# Patient Record
Sex: Male | Born: 1985
Health system: Southern US, Community
[De-identification: ages and names within clinical notes are randomized; demographics above are authoritative.]

## PROBLEM LIST (undated history)

## (undated) DIAGNOSIS — R569 Unspecified convulsions: Secondary | ICD-10-CM

## (undated) DIAGNOSIS — F909 Attention-deficit hyperactivity disorder, unspecified type: Secondary | ICD-10-CM

## (undated) DIAGNOSIS — G40909 Epilepsy, unspecified, not intractable, without status epilepticus: Secondary | ICD-10-CM

## (undated) DIAGNOSIS — M109 Gout, unspecified: Secondary | ICD-10-CM

## (undated) DIAGNOSIS — M1990 Unspecified osteoarthritis, unspecified site: Secondary | ICD-10-CM

## (undated) DIAGNOSIS — E669 Obesity, unspecified: Secondary | ICD-10-CM

## (undated) DIAGNOSIS — K219 Gastro-esophageal reflux disease without esophagitis: Secondary | ICD-10-CM

## (undated) DIAGNOSIS — R51 Headache: Secondary | ICD-10-CM

## (undated) HISTORY — PX: COLONOSCOPY: SHX174

## (undated) HISTORY — DX: Gout, unspecified: M10.9

## (undated) HISTORY — PX: BACK SURGERY: SHX140

---

## 2013-03-24 ENCOUNTER — Encounter (HOSPITAL_COMMUNITY): Payer: Self-pay | Admitting: Emergency Medicine

## 2013-03-24 ENCOUNTER — Emergency Department (HOSPITAL_COMMUNITY)
Admission: EM | Admit: 2013-03-24 | Discharge: 2013-03-25 | Disposition: A | Discharge status: Home / Self Care | Payer: PRIVATE HEALTH INSURANCE | Attending: Emergency Medicine | Admitting: Emergency Medicine

## 2013-03-24 DIAGNOSIS — Z8711 Personal history of peptic ulcer disease: Secondary | ICD-10-CM | POA: Insufficient documentation

## 2013-03-24 DIAGNOSIS — Z79899 Other long term (current) drug therapy: Secondary | ICD-10-CM | POA: Insufficient documentation

## 2013-03-24 DIAGNOSIS — F17200 Nicotine dependence, unspecified, uncomplicated: Secondary | ICD-10-CM | POA: Insufficient documentation

## 2013-03-24 DIAGNOSIS — M545 Low back pain, unspecified: Secondary | ICD-10-CM | POA: Insufficient documentation

## 2013-03-24 DIAGNOSIS — R1012 Left upper quadrant pain: Secondary | ICD-10-CM | POA: Insufficient documentation

## 2013-03-24 DIAGNOSIS — G40909 Epilepsy, unspecified, not intractable, without status epilepticus: Secondary | ICD-10-CM | POA: Insufficient documentation

## 2013-03-24 DIAGNOSIS — R109 Unspecified abdominal pain: Secondary | ICD-10-CM

## 2013-03-24 DIAGNOSIS — R1013 Epigastric pain: Secondary | ICD-10-CM | POA: Insufficient documentation

## 2013-03-24 DIAGNOSIS — R110 Nausea: Secondary | ICD-10-CM | POA: Insufficient documentation

## 2013-03-24 DIAGNOSIS — Z87442 Personal history of urinary calculi: Secondary | ICD-10-CM | POA: Insufficient documentation

## 2013-03-24 HISTORY — DX: Unspecified convulsions: R56.9

## 2013-03-24 NOTE — ED Notes (Signed)
Since 1300 today he had a sudden sharp pain in his lt upper abd with pain radiating up  Posteriorly and into his lt shoulder.  He has had ulcers gallstones

## 2013-03-25 ENCOUNTER — Emergency Department (HOSPITAL_COMMUNITY): Payer: PRIVATE HEALTH INSURANCE

## 2013-03-25 ENCOUNTER — Encounter (HOSPITAL_COMMUNITY): Payer: Self-pay | Admitting: Emergency Medicine

## 2013-03-25 ENCOUNTER — Emergency Department (HOSPITAL_COMMUNITY)
Admission: EM | Admit: 2013-03-25 | Discharge: 2013-03-26 | Disposition: A | Discharge status: Home / Self Care | Payer: PRIVATE HEALTH INSURANCE | Attending: Emergency Medicine | Admitting: Emergency Medicine

## 2013-03-25 DIAGNOSIS — Z79899 Other long term (current) drug therapy: Secondary | ICD-10-CM | POA: Insufficient documentation

## 2013-03-25 DIAGNOSIS — R110 Nausea: Secondary | ICD-10-CM | POA: Insufficient documentation

## 2013-03-25 DIAGNOSIS — R630 Anorexia: Secondary | ICD-10-CM | POA: Insufficient documentation

## 2013-03-25 DIAGNOSIS — R209 Unspecified disturbances of skin sensation: Secondary | ICD-10-CM | POA: Insufficient documentation

## 2013-03-25 DIAGNOSIS — R200 Anesthesia of skin: Secondary | ICD-10-CM

## 2013-03-25 DIAGNOSIS — M545 Low back pain, unspecified: Secondary | ICD-10-CM | POA: Insufficient documentation

## 2013-03-25 DIAGNOSIS — R1013 Epigastric pain: Secondary | ICD-10-CM | POA: Insufficient documentation

## 2013-03-25 DIAGNOSIS — R32 Unspecified urinary incontinence: Secondary | ICD-10-CM | POA: Insufficient documentation

## 2013-03-25 DIAGNOSIS — F909 Attention-deficit hyperactivity disorder, unspecified type: Secondary | ICD-10-CM | POA: Insufficient documentation

## 2013-03-25 DIAGNOSIS — R0789 Other chest pain: Secondary | ICD-10-CM | POA: Insufficient documentation

## 2013-03-25 DIAGNOSIS — G40909 Epilepsy, unspecified, not intractable, without status epilepticus: Secondary | ICD-10-CM | POA: Insufficient documentation

## 2013-03-25 DIAGNOSIS — R9431 Abnormal electrocardiogram [ECG] [EKG]: Secondary | ICD-10-CM | POA: Insufficient documentation

## 2013-03-25 DIAGNOSIS — F17200 Nicotine dependence, unspecified, uncomplicated: Secondary | ICD-10-CM | POA: Insufficient documentation

## 2013-03-25 DIAGNOSIS — K219 Gastro-esophageal reflux disease without esophagitis: Secondary | ICD-10-CM | POA: Insufficient documentation

## 2013-03-25 HISTORY — DX: Epilepsy, unspecified, not intractable, without status epilepticus: G40.909

## 2013-03-25 HISTORY — DX: Gastro-esophageal reflux disease without esophagitis: K21.9

## 2013-03-25 HISTORY — DX: Attention-deficit hyperactivity disorder, unspecified type: F90.9

## 2013-03-25 LAB — COMPREHENSIVE METABOLIC PANEL
ALT: 25 U/L (ref 0–53)
AST: 16 U/L (ref 0–37)
AST: 18 U/L (ref 0–37)
Albumin: 3.6 g/dL (ref 3.5–5.2)
Albumin: 3.9 g/dL (ref 3.5–5.2)
Alkaline Phosphatase: 48 U/L (ref 39–117)
Alkaline Phosphatase: 55 U/L (ref 39–117)
BUN: 18 mg/dL (ref 6–23)
BUN: 19 mg/dL (ref 6–23)
CO2: 27 mEq/L (ref 19–32)
Calcium: 8.9 mg/dL (ref 8.4–10.5)
Chloride: 104 mEq/L (ref 96–112)
Potassium: 3.4 mEq/L — ABNORMAL LOW (ref 3.5–5.1)
Potassium: 4 mEq/L (ref 3.5–5.1)
Sodium: 138 mEq/L (ref 135–145)
Sodium: 141 mEq/L (ref 135–145)
Total Bilirubin: 0.2 mg/dL — ABNORMAL LOW (ref 0.3–1.2)
Total Bilirubin: 0.3 mg/dL (ref 0.3–1.2)
Total Protein: 7 g/dL (ref 6.0–8.3)
Total Protein: 7.4 g/dL (ref 6.0–8.3)

## 2013-03-25 LAB — D-DIMER, QUANTITATIVE: D-Dimer, Quant: <0.27 ug/mL-FEU (ref 0.00–0.48)

## 2013-03-25 LAB — URINALYSIS, ROUTINE W REFLEX MICROSCOPIC
Glucose, UA: NEGATIVE mg/dL
Hgb urine dipstick: NEGATIVE
Ketones, ur: NEGATIVE mg/dL
Specific Gravity, Urine: 1.016 (ref 1.005–1.030)
Urobilinogen, UA: 1 mg/dL (ref 0.0–1.0)
pH: 7 (ref 5.0–8.0)

## 2013-03-25 LAB — CBC
HCT: 43 % (ref 39.0–52.0)
Hemoglobin: 15.5 g/dL (ref 13.0–17.0)
MCH: 31.4 pg (ref 26.0–34.0)
MCHC: 36 g/dL (ref 30.0–36.0)
Platelets: 225 10*3/uL (ref 150–400)
WBC: 9.9 10*3/uL (ref 4.0–10.5)

## 2013-03-25 LAB — LIPASE, BLOOD
Lipase: 26 U/L (ref 11–59)
Lipase: 29 U/L (ref 11–59)

## 2013-03-25 LAB — TROPONIN I: Troponin I: <0.3 ng/mL (ref <0.30)

## 2013-03-25 MED ORDER — GI COCKTAIL ~~LOC~~
30.0000 mL | Freq: Once | ORAL | Status: AC
  Administered 2013-03-25: 30 mL via ORAL
  Filled 2013-03-25: qty 30

## 2013-03-25 MED ORDER — HYDROMORPHONE HCL PF 1 MG/ML IJ SOLN
1.0000 mg | Freq: Once | INTRAMUSCULAR | Status: AC
  Administered 2013-03-25: 1 mg via INTRAVENOUS
  Filled 2013-03-25: qty 1

## 2013-03-25 MED ORDER — SODIUM CHLORIDE 0.9 % IV BOLUS (SEPSIS)
1000.0000 mL | Freq: Once | INTRAVENOUS | Status: AC
  Administered 2013-03-25: 1000 mL via INTRAVENOUS

## 2013-03-25 MED ORDER — PANTOPRAZOLE SODIUM 20 MG PO TBEC: 20.0000 mg | DELAYED_RELEASE_TABLET | Freq: Every day | ORAL | Status: DC

## 2013-03-25 MED ORDER — PANTOPRAZOLE SODIUM 40 MG PO TBEC
40.0000 mg | DELAYED_RELEASE_TABLET | Freq: Once | ORAL | Status: AC
  Administered 2013-03-25: 40 mg via ORAL
  Filled 2013-03-25: qty 1

## 2013-03-25 MED ORDER — FAMOTIDINE 20 MG PO TABS: 20.0000 mg | ORAL_TABLET | Freq: Two times a day (BID) | ORAL | Status: DC

## 2013-03-25 MED ORDER — POTASSIUM CHLORIDE CRYS ER 20 MEQ PO TBCR
40.0000 meq | EXTENDED_RELEASE_TABLET | Freq: Once | ORAL | Status: AC
  Administered 2013-03-25: 40 meq via ORAL
  Filled 2013-03-25: qty 2

## 2013-03-25 MED ORDER — MORPHINE SULFATE 4 MG/ML IJ SOLN
6.0000 mg | Freq: Once | INTRAMUSCULAR | Status: AC
  Administered 2013-03-25: 6 mg via INTRAVENOUS
  Filled 2013-03-25: qty 2

## 2013-03-25 MED ORDER — ONDANSETRON HCL 4 MG/2ML IJ SOLN
4.0000 mg | Freq: Once | INTRAMUSCULAR | Status: AC
  Administered 2013-03-25: 4 mg via INTRAVENOUS
  Filled 2013-03-25: qty 2

## 2013-03-25 MED ORDER — TRAMADOL HCL 50 MG PO TABS: 50.0000 mg | ORAL_TABLET | Freq: Four times a day (QID) | ORAL | Status: DC | PRN

## 2013-03-25 MED ORDER — FAMOTIDINE 20 MG PO TABS
20.0000 mg | ORAL_TABLET | Freq: Once | ORAL | Status: AC
  Administered 2013-03-25: 20 mg via ORAL
  Filled 2013-03-25: qty 1

## 2013-03-25 NOTE — ED Notes (Signed)
Pt reports he has a hx of gout in his left foot, pt c/o right foot pain, unsure if it is gout related.

## 2013-03-25 NOTE — ED Provider Notes (Signed)
CSN: [id number]     Arrival date & time 03/24/13  2337 History   First MD Initiated Contact with Patient 03/25/13 0000     Chief Complaint  Patient presents with  . Abdominal Pain   (Consider location/radiation/quality/duration/timing/severity/associated sxs/prior Treatment) HPI Hx per PT - Epigastric and LUQ ABD pain onset today around 1pm and has been constant since then.  Hurst to breath but no SOB otherwise. No CP. Some radiation of pain to his back. No new medications. Ate pizza and hot wings earlier. Has h/o ulcers and gallstones. No RUQ pain. Some nausea earlier but has resolved, no vomiting or diarrhea.  No blood in stools, no dark or tarry stools. No F/C. Mother bedside concerned about him c/o SOB earlier. No h/o DVT or PE, stills smokes tob on [place] Past Medical History  Diagnosis Date  . Seizures    History reviewed. No pertinent past surgical history. No family history on file. History  Substance Use Topics  . Smoking status: Current Every Day Smoker  . Smokeless tobacco: Not on file  . Alcohol Use: Yes    Review of Systems  Constitutional: Negative for fever and chills.  Respiratory: Negative for cough and wheezing.   Cardiovascular: Negative for chest pain.  Gastrointestinal: Positive for nausea and abdominal pain.  Genitourinary: Negative for flank pain.  Musculoskeletal: Negative for back pain, neck pain and neck stiffness.  Skin: Negative for rash.  Neurological: Negative for headaches.  All other systems reviewed and are negative.    Allergies  Review of patient's allergies indicates no known allergies.  Home Medications   Current Outpatient Rx  Name  Route  Sig  Dispense  Refill  . amphetamine-dextroamphetamine (ADDERALL) 10 MG tablet   Oral   Take 1 tablet by mouth daily.         . divalproex (DEPAKOTE ER) 500 MG 24 hr tablet   Oral   Take 1 tablet by mouth daily.         Marland Kitchen topiramate (TOPAMAX) 100 MG tablet   Oral   Take 1 tablet by  mouth 2 (two) times daily.          BP 127/82  Pulse 92  Temp(Src) 97.9 F (36.6 C)  Resp 20  Ht 5\' 11"  (1.803 m)  Wt 238 lb (107.956 kg)  BMI 33.21 kg/m2  SpO2 97% Physical Exam  Constitutional: He is oriented to person, place, and time. He appears well-developed and well-nourished.  HENT:  Head: Normocephalic and atraumatic.  Eyes: EOM are normal. Pupils are equal, round, and reactive to light. No scleral icterus.  Neck: Neck supple.  Cardiovascular: Normal rate, regular rhythm and intact distal pulses.   Pulmonary/Chest: Effort normal and breath sounds normal. No respiratory distress.  Abdominal: Soft. He exhibits no distension and no mass. There is no rebound and no guarding.  TTP epigastric and LUQ, no RUQ TTP   Musculoskeletal: Normal range of motion. He exhibits no edema.  Neurological: He is alert and oriented to person, place, and time.  Skin: Skin is warm and dry.    ED Course  Procedures (including critical care time) Labs Review Labs Reviewed  COMPREHENSIVE METABOLIC PANEL - Abnormal; Notable for the following:    Potassium 3.4 (*)    Glucose, Bld 104 (*)    Total Bilirubin 0.2 (*)    All other components within normal limits  CBC  LIPASE, BLOOD  URINALYSIS, ROUTINE W REFLEX MICROSCOPIC  D-DIMER, QUANTITATIVE   Imaging Review Dg Chest  2 View  03/25/2013   CLINICAL DATA:  Abdominal pain.  EXAM: CHEST  2 VIEW  COMPARISON:  None.  FINDINGS: The heart size and mediastinal contours are within normal limits. Both lungs are clear. The visualized skeletal structures are unremarkable.  IMPRESSION: No active cardiopulmonary disease.   Electronically Signed   By: Tiburcio Pea M.D.   On: 03/25/2013 00:45   US Abdomen Complete  03/25/2013   CLINICAL DATA:  Epigastric pain.  History of gallstones.  EXAM: ULTRASOUND ABDOMEN COMPLETE  COMPARISON:  None.  FINDINGS: Gallbladder:  The gallbladder is focally tender per sonographic exam, but is not dilated and shows no  stones. Wall thickness can be accounted for by underdistention.  Common bile duct:  Diameter: 6 mm which is upper normal. No cholestasis on contemporaneously labs.  Liver:  Increased echogenicity.  No focal abnormality.  IVC:  No abnormality visualized.  Pancreas:  Mainly obscured.  Spleen:  Size and appearance within normal limits.  Right Kidney:  Length: 12 cm. Echogenicity within normal limits. No mass or hydronephrosis visualized.  Left Kidney:  Length: 11 cm. Echogenicity within normal limits. No mass or hydronephrosis visualized.  Abdominal aorta:  No aneurysm visualized.  Other findings:  None.  IMPRESSION: 1. Negative for gallstones or acute cholecystitis. 2.  Fatty liver.   Electronically Signed   By: Tiburcio Pea M.D.   On: 03/25/2013 04:12   GI cocktail, Pepcid, Protonix.  On recheck pain unchanged. IV Dilaudid and Zofran provided  On recheck is now having right upper quadrant pain and requesting ultrasound to evaluate his gallbladder.   IV Dilaudid repeated.  5:04 AM abdominal pain improved.  Has history of ulcers. Plan discharge home with prescription for Pepcid Protonix. GERD diet instructions given. Patient will avoid NSAIDs.  He'll follow up with primary care physician at [facility]. He will follow up with gastroenterology as needed. Peptic ulcer disease precautions provided verbalized as understood. He is now also complaining of low back pain. He is followed by Dr. Jeral Fruit for this. He has some lower paralumbar tenderness without lower extremity deficits. He was also given a prescription for Ultram as needed.   MDM  Diagnosis: Epigastric pain with history of peptic ulcer disease. Workup as above: Labs reviewed mild hypokalemia, normal liver enzymes, normal lipase. Urinalysis reviewed no hematuria. Chest x-ray reviewed no free air an ultrasound reviewed no gallstones  Pain improved with medications including IV narcotics. Vital signs and nurses notes reviewed and considered.  Sunnie Nielsen,  MD 03/25/13 [phone]

## 2013-03-25 NOTE — ED Notes (Signed)
Patient presents with c/o left leg numbness that has traveled up to his left side.  States that he has noticed that he urinates while sleep occassionally

## 2013-03-26 ENCOUNTER — Emergency Department (HOSPITAL_COMMUNITY): Payer: PRIVATE HEALTH INSURANCE

## 2013-03-26 ENCOUNTER — Encounter (HOSPITAL_COMMUNITY): Payer: Self-pay | Admitting: Emergency Medicine

## 2013-03-26 LAB — URINALYSIS, ROUTINE W REFLEX MICROSCOPIC
Glucose, UA: NEGATIVE mg/dL
Ketones, ur: NEGATIVE mg/dL
Leukocytes, UA: NEGATIVE
Nitrite: NEGATIVE
pH: 7.5 (ref 5.0–8.0)

## 2013-03-26 MED ORDER — HYDROMORPHONE HCL PF 1 MG/ML IJ SOLN
1.0000 mg | INTRAMUSCULAR | Status: DC | PRN
  Administered 2013-03-26 (×2): 1 mg via INTRAVENOUS
  Filled 2013-03-26 (×2): qty 1

## 2013-03-26 MED ORDER — ONDANSETRON HCL 4 MG/2ML IJ SOLN
4.0000 mg | Freq: Once | INTRAMUSCULAR | Status: AC
  Administered 2013-03-26: 4 mg via INTRAVENOUS
  Filled 2013-03-26: qty 2

## 2013-03-26 MED ORDER — PREDNISONE 20 MG PO TABS: ORAL_TABLET | ORAL | Status: DC

## 2013-03-26 MED ORDER — OXYCODONE-ACETAMINOPHEN 5-325 MG PO TABS: 2.0000 | ORAL_TABLET | ORAL | Status: DC | PRN

## 2013-03-26 MED ORDER — HYDROMORPHONE HCL PF 1 MG/ML IJ SOLN
1.0000 mg | Freq: Once | INTRAMUSCULAR | Status: AC
  Administered 2013-03-26: 1 mg via INTRAVENOUS
  Filled 2013-03-26: qty 1

## 2013-03-26 MED ORDER — KETOROLAC TROMETHAMINE 30 MG/ML IJ SOLN
30.0000 mg | Freq: Once | INTRAMUSCULAR | Status: AC
  Administered 2013-03-26: 30 mg via INTRAVENOUS
  Filled 2013-03-26: qty 1

## 2013-03-26 NOTE — ED Provider Notes (Addendum)
CSN: [id number]     Arrival date & time 03/25/13  1940 History   First MD Initiated Contact with Patient 03/25/13 2228     Chief Complaint  Patient presents with  . Leg Pain  . Back Pain  . Numbness   (Consider location/radiation/quality/duration/timing/severity/associated sxs/prior Treatment) HPI Comments: 27 yo male with ?disc herniation hx presents with lower back pain, left leg numbness, abdominal pain and urinary incontinence. Pt has had mild sxs in the past but these have worsened the past 2 days.  Pt had a few episodes of urine incontinence while sleeping the past two nights, new for pt.  No exertional or diaphoresis. No MRI hx, pt has had xrays. He works at the [employer].  No fevers.  Epig pain similar to previous visit yesterday, left lower chest discomfort, ache, constant, worse with movement.  No blood clot risks.  No cardiac hx or risk factors except smoking. Ulcer hx.  Mild etoh hx.  No injuries.  No vomiting.   Patient is a 27 y.o. male presenting with leg pain and back pain. The history is provided by the patient.  Leg Pain Associated symptoms: back pain   Associated symptoms: no fever   Back Pain Associated symptoms: chest pain (left lower vs abdo), leg pain and numbness   Associated symptoms: no dysuria, no fever, no headaches and no weakness     Past Medical History  Diagnosis Date  . Seizures   . Epilepsy    History reviewed. No pertinent past surgical history. History reviewed. No pertinent family history. History  Substance Use Topics  . Smoking status: Current Every Day Smoker  . Smokeless tobacco: Not on file  . Alcohol Use: Yes    Review of Systems  Constitutional: Positive for appetite change. Negative for fever and chills.  HENT: Negative for congestion.   Eyes: Negative for visual disturbance.  Respiratory: Negative for shortness of breath.   Cardiovascular: Positive for chest pain (left lower vs abdo).  Gastrointestinal: Positive for nausea.  Negative for vomiting.  Genitourinary: Negative for dysuria.  Musculoskeletal: Positive for back pain.  Skin: Negative for wound.  Neurological: Positive for numbness. Negative for weakness, light-headedness and headaches.    Allergies  Review of patient's allergies indicates no known allergies.  Home Medications   Current Outpatient Rx  Name  Route  Sig  Dispense  Refill  . amphetamine-dextroamphetamine (ADDERALL) 10 MG tablet   Oral   Take 1 tablet by mouth daily.         . divalproex (DEPAKOTE ER) 250 MG 24 hr tablet   Oral   Take 250 mg by mouth 2 (two) times daily.          . divalproex (DEPAKOTE ER) 500 MG 24 hr tablet   Oral   Take 1 tablet by mouth 2 (two) times daily.          . famotidine (PEPCID) 20 MG tablet   Oral   Take 1 tablet (20 mg total) by mouth 2 (two) times daily.   30 tablet   0   . pantoprazole (PROTONIX) 20 MG tablet   Oral   Take 1 tablet (20 mg total) by mouth daily.   30 tablet   0   . topiramate (TOPAMAX) 100 MG tablet   Oral   Take 1 tablet by mouth 2 (two) times daily.         . traMADol (ULTRAM) 50 MG tablet   Oral   Take 1 tablet (  50 mg total) by mouth every 6 (six) hours as needed.   15 tablet   0    BP 142/73  Pulse 88  Temp(Src) 98 F (36.7 C) (Oral)  Resp 24  SpO2 99% Physical Exam  Nursing note and vitals reviewed. Constitutional: He is oriented to person, place, and time. He appears well-developed and well-nourished.  HENT:  Head: Normocephalic and atraumatic.  Eyes: Conjunctivae are normal. Right eye exhibits no discharge. Left eye exhibits no discharge.  Neck: Normal range of motion. Neck supple. No tracheal deviation present.  Cardiovascular: Normal rate and regular rhythm.   Pulmonary/Chest: Effort normal and breath sounds normal.  Abdominal: Soft. He exhibits no distension. There is tenderness (epig). There is no guarding.  Musculoskeletal: He exhibits tenderness (left paraspinal lumbar). He exhibits  no edema.  Neurological: He is alert and oriented to person, place, and time. GCS eye subscore is 4. GCS verbal subscore is 5. GCS motor subscore is 6.  5+ strength in UE and LE with f/e at major joints. Sensation to palpation intact in UE and LE. CNs 2-12 grossly intact.  EOMFI.  PERRL.   Finger nose and coordination intact bilateral.   Visual fields intact to finger testing. Decreased sensation left LE vs right  Skin: Skin is warm. No rash noted.  Psychiatric: He has a normal mood and affect.    ED Course  Procedures (including critical care time) Limited Ultrasound of bladder  Performed by Dr. Jodi Mourning Indication: to assess for urinary retention and/or bladder volume prior to urinary catheter Technique:  Low frequency probe utilized in two planes to assess bladder volume in real-time. Findings: Bladder volume approx 60 ml post void residual Additional findings: none Images were archived electronically  Labs Review Labs Reviewed  TROPONIN I  LIPASE, BLOOD  COMPREHENSIVE METABOLIC PANEL  URINALYSIS, ROUTINE W REFLEX MICROSCOPIC   Imaging Review Dg Chest 2 View  03/25/2013   CLINICAL DATA:  Abdominal pain.  EXAM: CHEST  2 VIEW  COMPARISON:  None.  FINDINGS: The heart size and mediastinal contours are within normal limits. Both lungs are clear. The visualized skeletal structures are unremarkable.  IMPRESSION: No active cardiopulmonary disease.   Electronically Signed   By: Tiburcio Pea M.D.   On: 03/25/2013 00:45   US Abdomen Complete  03/25/2013   CLINICAL DATA:  Epigastric pain.  History of gallstones.  EXAM: ULTRASOUND ABDOMEN COMPLETE  COMPARISON:  None.  FINDINGS: Gallbladder:  The gallbladder is focally tender per sonographic exam, but is not dilated and shows no stones. Wall thickness can be accounted for by underdistention.  Common bile duct:  Diameter: 6 mm which is upper normal. No cholestasis on contemporaneously labs.  Liver:  Increased echogenicity.  No focal  abnormality.  IVC:  No abnormality visualized.  Pancreas:  Mainly obscured.  Spleen:  Size and appearance within normal limits.  Right Kidney:  Length: 12 cm. Echogenicity within normal limits. No mass or hydronephrosis visualized.  Left Kidney:  Length: 11 cm. Echogenicity within normal limits. No mass or hydronephrosis visualized.  Abdominal aorta:  No aneurysm visualized.  Other findings:  None.  IMPRESSION: 1. Negative for gallstones or acute cholecystitis. 2.  Fatty liver.   Electronically Signed   By: Tiburcio Pea M.D.   On: 03/25/2013 04:12    EKG Interpretation    Date/Time:  Monday March 25 2013 23:19:47 EST Ventricular Rate:  81 PR Interval:    QRS Duration: 115 QT Interval:  353 QTC Calculation: 410 R  Axis:   152 Text Interpretation:  Accelerated junctional rhythm IRBBB and LPFB Anterior infarct, age indeterminate Lateral leads are also involved Similar to yesterday Confirmed by Victorian Gunn  MD, Tiron Suski ([id number]) on 03/26/2013 12:36:12 AM            MDM  No diagnosis found. Multiple sxs.  Pt primary complaint back pain and left leg numbness.  ?disc herniation Abdo benign, epig pain. Recent US gb neg, cancelled CT at this time, pt has ulcer hx - possibly a cause, alcohol use may be related, no guarding.  MRI lumbar ordered with left leg sxs/ incontinence.  Post void residual done, 65 ml approx.  Pain meds given.  Signed out to reassess and fup MRI.   Epig pain, Lower back pain Enid Skeens, MD 03/26/13 1610  Enid Skeens, MD 03/26/13 9604  Enid Skeens, MD 03/26/13 [phone]

## 2013-03-26 NOTE — ED Notes (Signed)
Patient transported to MRI 

## 2013-03-26 NOTE — ED Notes (Signed)
Pt resting quietly at this time

## 2013-03-26 NOTE — ED Provider Notes (Signed)
Patient presented to the ER for multiple complaints. He had a workup, which was ordered to have lumbar MRI because of his radicular back pain. Is awaiting MRI at time of my shift. MRI has been reviewed, patient does have retrolisthesis present, likely causing the patient's current symptoms. He does not have any significant neurologic deficits. He will be treated with Percocet, prednisone. Follow up with neurosurgery. Patient works for [employer].  Gilda Crease, MD 03/26/13 [phone]

## 2013-03-26 NOTE — ED Notes (Signed)
Patient transported to new room assignment. 

## 2013-04-02 ENCOUNTER — Encounter (HOSPITAL_COMMUNITY): Payer: Self-pay | Admitting: Pharmacy Technician

## 2013-04-02 ENCOUNTER — Other Ambulatory Visit (HOSPITAL_COMMUNITY): Payer: Self-pay | Admitting: *Deleted

## 2013-04-02 ENCOUNTER — Encounter (HOSPITAL_COMMUNITY): Payer: Self-pay

## 2013-04-02 ENCOUNTER — Other Ambulatory Visit: Payer: Self-pay | Admitting: Neurosurgery

## 2013-04-02 ENCOUNTER — Encounter (HOSPITAL_COMMUNITY)
Admission: RE | Admit: 2013-04-02 | Discharge: 2013-04-02 | Disposition: A | Discharge status: Home / Self Care | Payer: PRIVATE HEALTH INSURANCE | Source: Ambulatory Visit | Attending: Neurosurgery | Admitting: Neurosurgery

## 2013-04-02 DIAGNOSIS — Z01812 Encounter for preprocedural laboratory examination: Secondary | ICD-10-CM | POA: Insufficient documentation

## 2013-04-02 HISTORY — DX: Unspecified osteoarthritis, unspecified site: M19.90

## 2013-04-02 HISTORY — DX: Headache: R51

## 2013-04-02 LAB — TYPE AND SCREEN
ABO/RH(D): A POS
Antibody Screen: NEGATIVE

## 2013-04-02 LAB — SURGICAL PCR SCREEN: MRSA, PCR: NEGATIVE

## 2013-04-02 LAB — ABO/RH: ABO/RH(D): A POS

## 2013-04-02 NOTE — Pre-Procedure Instructions (Signed)
Christopher Gilbert  04/02/2013   Your procedure is scheduled on:  Friday, April 05, 2013 at 11:15 AM.   Report to [facility] Entrance "A" Admitting Office at 9:15 AM.   Call this number if you have problems the morning of surgery: [phone]   Remember:   Do not eat food or drink liquids after midnight Thursday, 04/04/13.   Take these medicines the morning of surgery with A SIP OF WATER: divalproex (DEPAKOTE ER), famotidine (PEPCID),  pantoprazole (PROTONIX),  topiramate (TOPAMAX), predniSONE (DELTASONE), traMADol (ULTRAM) - if needed.    Do not wear jewelry.  Do not wear lotions, powders, or cologne. You may wear deodorant.  Men may shave face and neck.  Do not bring valuables to the hospital.  [facility] is not responsible                  for any belongings or valuables.               Contacts, dentures or bridgework may not be worn into surgery.  Leave suitcase in the car. After surgery it may be brought to your room.  For patients admitted to the hospital, discharge time is determined by your                treatment team.                Special Instructions: Shower using CHG 2 nights before surgery and the night before surgery.  If you shower the day of surgery use CHG.  Use special wash - you have one bottle of CHG for all showers.  You should use approximately 1/3 of the bottle for each shower.   Please read over the following fact sheets that you were given: Pain Booklet, Coughing and Deep Breathing, Blood Transfusion Information, MRSA Information and Surgical Site Infection Prevention

## 2013-04-03 NOTE — H&P (Signed)
Christopher Gilbert is an 27 y.o. male.   Chief Complaint: lbp HPI: Christopher Gilbert works with [place] at [employer]. He has a history of lumbar pain with radiation to the lower extremities. Few months ago he had an epidutal injection by Christopher Gilbert and a lumbar corset was given. Nevertheless the pain is getting worse, with radiation to the left leg. Second epidural did not help and now he has been having off and on difficulties voiding. Mri lumbar was done   Past Medical History  Diagnosis Date  . Attention deficit hyperactivity disorder   . GERD (gastroesophageal reflux disease)   . Headache(784.0)     migraines after seizure  . Seizures     grand mal, haven't had a seizure in "a long time"  . Epilepsy   . Arthritis     gout in right big toe    No past surgical history on file.  No family history on file. Social History:  reports that he has been smoking Cigarettes.  He has been smoking about 0.00 packs per day. He has never used smokeless tobacco. He reports that he drinks alcohol. He reports that he does not use illicit drugs.  Allergies: No Known Allergies  No prescriptions prior to admission    Results for orders placed during the hospital encounter of 04/02/13 (from the past 48 hour(s))  TYPE AND SCREEN     Status: None   Collection Time    04/02/13  3:47 PM      Result Value Range   ABO/RH(D) A POS     Antibody Screen NEG     Sample Expiration 04/16/2013    SURGICAL PCR SCREEN     Status: None   Collection Time    04/02/13  3:50 PM      Result Value Range   MRSA, PCR NEGATIVE  NEGATIVE   Staphylococcus aureus NEGATIVE  NEGATIVE   Comment:            The Xpert SA Assay (FDA     approved for NASAL specimens     in patients over 41 years of age),     is one component of     a comprehensive surveillance     program.  Test performance has     been validated by [employer] for patients greater     than or equal to 79 year old.     It is not intended     to diagnose  infection nor to     guide or monitor treatment.  ABO/RH     Status: None   Collection Time    04/02/13  3:52 PM      Result Value Range   ABO/RH(D) A POS     No results found.  Review of Systems  Constitutional: Negative.   HENT: Negative.   Eyes: Negative.   Respiratory: Negative.   Cardiovascular: Negative.   Gastrointestinal: Positive for heartburn.  Genitourinary: Negative.        Gout  Musculoskeletal: Positive for back pain.  Skin: Negative.   Neurological: Positive for sensory change, focal weakness and seizures.  Endo/Heme/Allergies: Negative.   Psychiatric/Behavioral: Negative.        Add    There were no vitals taken for this visit. Physical Exam walking with a stiff back and limping from the left leg. Hent,nl. Neck, nl. Cv, nl. Lungs, clear. Abdomen,soft. Extemities, nl. NEURO weakness of DF LEFT 2/5. NUMBNESS WHICHINVOLVES THE  L5-S1 DERMATOMES. SLR POSITIVE AT 30 in the left and 60 on the right. Mri and lumbar x-rays shows a grsde 2 l5s1 spondylolisthesis and retrolisthesis at l4-5 which do not move  Assessment/Plan Christopher Gilbert wants to go ahead with surgery. The procedure will be a l5S1 fusion with screws and possible cages if the space allows [place]. Also will explore the l45 space to see if he has significant pathology and needs to be involved. He is aware of risks and benefits.   Christopher Gilbert 04/03/2013, 12:34 PM

## 2013-04-04 MED ORDER — CEFAZOLIN SODIUM-DEXTROSE 2-3 GM-% IV SOLR
2.0000 g | INTRAVENOUS | Status: AC
  Administered 2013-04-05: 2 g via INTRAVENOUS
  Filled 2013-04-04: qty 50

## 2013-04-05 ENCOUNTER — Inpatient Hospital Stay (HOSPITAL_COMMUNITY)
Admission: RE | Admit: 2013-04-05 | Discharge: 2013-04-08 | DRG: 460 | Disposition: A | Discharge status: Home / Self Care | Payer: PRIVATE HEALTH INSURANCE | Source: Ambulatory Visit | Attending: Neurosurgery | Admitting: Neurosurgery

## 2013-04-05 ENCOUNTER — Inpatient Hospital Stay (HOSPITAL_COMMUNITY): Payer: PRIVATE HEALTH INSURANCE

## 2013-04-05 ENCOUNTER — Encounter (HOSPITAL_COMMUNITY): Payer: Self-pay

## 2013-04-05 ENCOUNTER — Inpatient Hospital Stay (HOSPITAL_COMMUNITY): Payer: PRIVATE HEALTH INSURANCE | Admitting: Anesthesiology

## 2013-04-05 ENCOUNTER — Encounter (HOSPITAL_COMMUNITY): Payer: PRIVATE HEALTH INSURANCE | Admitting: Anesthesiology

## 2013-04-05 ENCOUNTER — Encounter (HOSPITAL_COMMUNITY)
Admission: RE | Disposition: A | Discharge status: Home / Self Care | Payer: PRIVATE HEALTH INSURANCE | Source: Ambulatory Visit | Attending: Neurosurgery

## 2013-04-05 DIAGNOSIS — F909 Attention-deficit hyperactivity disorder, unspecified type: Secondary | ICD-10-CM | POA: Diagnosis present

## 2013-04-05 DIAGNOSIS — M129 Arthropathy, unspecified: Secondary | ICD-10-CM | POA: Diagnosis present

## 2013-04-05 DIAGNOSIS — G40309 Generalized idiopathic epilepsy and epileptic syndromes, not intractable, without status epilepticus: Secondary | ICD-10-CM | POA: Diagnosis present

## 2013-04-05 DIAGNOSIS — M4316 Spondylolisthesis, lumbar region: Secondary | ICD-10-CM | POA: Diagnosis present

## 2013-04-05 DIAGNOSIS — K219 Gastro-esophageal reflux disease without esophagitis: Secondary | ICD-10-CM | POA: Diagnosis present

## 2013-04-05 DIAGNOSIS — M109 Gout, unspecified: Secondary | ICD-10-CM | POA: Diagnosis present

## 2013-04-05 DIAGNOSIS — Z01812 Encounter for preprocedural laboratory examination: Secondary | ICD-10-CM

## 2013-04-05 DIAGNOSIS — F17200 Nicotine dependence, unspecified, uncomplicated: Secondary | ICD-10-CM | POA: Diagnosis present

## 2013-04-05 DIAGNOSIS — M4310 Spondylolisthesis, site unspecified: Principal | ICD-10-CM | POA: Diagnosis present

## 2013-04-05 SURGERY — POSTERIOR LUMBAR FUSION 1 LEVEL
Anesthesia: General

## 2013-04-05 MED ORDER — LACTATED RINGERS IV SOLN
INTRAVENOUS | Status: DC
  Administered 2013-04-05 (×5): via INTRAVENOUS

## 2013-04-05 MED ORDER — DIAZEPAM 5 MG PO TABS
5.0000 mg | ORAL_TABLET | Freq: Four times a day (QID) | ORAL | Status: DC | PRN
  Administered 2013-04-07: 5 mg via ORAL
  Filled 2013-04-05: qty 1

## 2013-04-05 MED ORDER — MENTHOL 3 MG MT LOZG: 1.0000 | LOZENGE | OROMUCOSAL | Status: DC | PRN

## 2013-04-05 MED ORDER — PROPOFOL 10 MG/ML IV BOLUS
INTRAVENOUS | Status: DC | PRN
  Administered 2013-04-05: 180 mg via INTRAVENOUS

## 2013-04-05 MED ORDER — SODIUM CHLORIDE 0.9 % IJ SOLN
3.0000 mL | Freq: Two times a day (BID) | INTRAMUSCULAR | Status: DC
  Administered 2013-04-06 – 2013-04-07 (×3): 3 mL via INTRAVENOUS

## 2013-04-05 MED ORDER — ROCURONIUM BROMIDE 100 MG/10ML IV SOLN
INTRAVENOUS | Status: DC | PRN
  Administered 2013-04-05: 50 mg via INTRAVENOUS

## 2013-04-05 MED ORDER — HYDROMORPHONE HCL PF 1 MG/ML IJ SOLN
INTRAMUSCULAR | Status: DC | PRN
  Administered 2013-04-05 (×3): 1 mg via INTRAVENOUS

## 2013-04-05 MED ORDER — SODIUM CHLORIDE 0.9 % IR SOLN
Status: DC | PRN
  Administered 2013-04-05: 1

## 2013-04-05 MED ORDER — SODIUM CHLORIDE 0.9 % IV SOLN: 250.0000 mL | INTRAVENOUS | Status: DC

## 2013-04-05 MED ORDER — PANTOPRAZOLE SODIUM 20 MG PO TBEC
20.0000 mg | DELAYED_RELEASE_TABLET | Freq: Every day | ORAL | Status: DC
  Filled 2013-04-05 (×3): qty 1

## 2013-04-05 MED ORDER — ONDANSETRON HCL 4 MG/2ML IJ SOLN: 4.0000 mg | INTRAMUSCULAR | Status: DC | PRN

## 2013-04-05 MED ORDER — DIVALPROEX SODIUM ER 250 MG PO TB24
250.0000 mg | ORAL_TABLET | Freq: Two times a day (BID) | ORAL | Status: DC
  Administered 2013-04-05 – 2013-04-06 (×2): 250 mg via ORAL
  Administered 2013-04-06: 750 mg via ORAL
  Administered 2013-04-07: 250 mg via ORAL
  Filled 2013-04-05 (×5): qty 1

## 2013-04-05 MED ORDER — HYDROMORPHONE HCL PF 1 MG/ML IJ SOLN: 0.2500 mg | INTRAMUSCULAR | Status: DC | PRN

## 2013-04-05 MED ORDER — SODIUM CHLORIDE 0.9 % IJ SOLN: 3.0000 mL | INTRAMUSCULAR | Status: DC | PRN

## 2013-04-05 MED ORDER — FENTANYL CITRATE 0.05 MG/ML IJ SOLN
INTRAMUSCULAR | Status: DC | PRN
  Administered 2013-04-05: 100 ug via INTRAVENOUS
  Administered 2013-04-05: 150 ug via INTRAVENOUS
  Administered 2013-04-05: 100 ug via INTRAVENOUS
  Administered 2013-04-05 (×4): 50 ug via INTRAVENOUS

## 2013-04-05 MED ORDER — MIDAZOLAM HCL 5 MG/5ML IJ SOLN
INTRAMUSCULAR | Status: DC | PRN
  Administered 2013-04-05: 2 mg via INTRAVENOUS

## 2013-04-05 MED ORDER — CEFAZOLIN SODIUM 1-5 GM-% IV SOLN
1.0000 g | Freq: Three times a day (TID) | INTRAVENOUS | Status: AC
  Administered 2013-04-05 – 2013-04-06 (×2): 1 g via INTRAVENOUS
  Filled 2013-04-05 (×2): qty 50

## 2013-04-05 MED ORDER — OXYCODONE-ACETAMINOPHEN 5-325 MG PO TABS
1.0000 | ORAL_TABLET | ORAL | Status: DC | PRN
  Administered 2013-04-07 – 2013-04-08 (×3): 2 via ORAL
  Filled 2013-04-05 (×3): qty 2

## 2013-04-05 MED ORDER — OXYCODONE HCL 5 MG PO TABS: 5.0000 mg | ORAL_TABLET | Freq: Once | ORAL | Status: DC | PRN

## 2013-04-05 MED ORDER — FAMOTIDINE 20 MG PO TABS
20.0000 mg | ORAL_TABLET | Freq: Two times a day (BID) | ORAL | Status: DC
  Administered 2013-04-05 – 2013-04-07 (×3): 20 mg via ORAL
  Filled 2013-04-05 (×7): qty 1

## 2013-04-05 MED ORDER — PHENOL 1.4 % MT LIQD: 1.0000 | OROMUCOSAL | Status: DC | PRN

## 2013-04-05 MED ORDER — FENTANYL CITRATE 0.05 MG/ML IJ SOLN: 25.0000 ug | INTRAMUSCULAR | Status: DC | PRN

## 2013-04-05 MED ORDER — ONDANSETRON HCL 4 MG/2ML IJ SOLN
INTRAMUSCULAR | Status: DC | PRN
  Administered 2013-04-05: 4 mg via INTRAVENOUS

## 2013-04-05 MED ORDER — HYDROMORPHONE HCL PF 1 MG/ML IJ SOLN
INTRAMUSCULAR | Status: AC
  Filled 2013-04-05: qty 2

## 2013-04-05 MED ORDER — LIDOCAINE HCL (CARDIAC) 20 MG/ML IV SOLN
INTRAVENOUS | Status: DC | PRN
  Administered 2013-04-05: 20 mg via INTRAVENOUS

## 2013-04-05 MED ORDER — SODIUM CHLORIDE 0.9 % IV SOLN
INTRAVENOUS | Status: DC
  Administered 2013-04-05: 22:00:00 via INTRAVENOUS

## 2013-04-05 MED ORDER — THROMBIN 20000 UNITS EX SOLR
CUTANEOUS | Status: DC | PRN
  Administered 2013-04-05: 13:00:00 via TOPICAL

## 2013-04-05 MED ORDER — INFLUENZA VAC SPLIT QUAD 0.5 ML IM SUSP
0.5000 mL | INTRAMUSCULAR | Status: AC
  Administered 2013-04-06: 0.5 mL via INTRAMUSCULAR
  Filled 2013-04-05: qty 0.5

## 2013-04-05 MED ORDER — AMPHETAMINE-DEXTROAMPHETAMINE 10 MG PO TABS
10.0000 mg | ORAL_TABLET | Freq: Every day | ORAL | Status: DC
  Filled 2013-04-05 (×2): qty 1

## 2013-04-05 MED ORDER — MORPHINE SULFATE (PF) 1 MG/ML IV SOLN
INTRAVENOUS | Status: AC
  Filled 2013-04-05: qty 25

## 2013-04-05 MED ORDER — TOPIRAMATE 100 MG PO TABS
100.0000 mg | ORAL_TABLET | Freq: Two times a day (BID) | ORAL | Status: DC
  Administered 2013-04-05 – 2013-04-08 (×6): 100 mg via ORAL
  Filled 2013-04-05 (×8): qty 1

## 2013-04-05 MED ORDER — BUPIVACAINE LIPOSOME 1.3 % IJ SUSP
20.0000 mL | INTRAMUSCULAR | Status: AC
  Filled 2013-04-05: qty 20

## 2013-04-05 MED ORDER — ONDANSETRON HCL 4 MG/2ML IJ SOLN: 4.0000 mg | Freq: Four times a day (QID) | INTRAMUSCULAR | Status: DC | PRN

## 2013-04-05 MED ORDER — DIPHENHYDRAMINE HCL 12.5 MG/5ML PO ELIX: 12.5000 mg | ORAL_SOLUTION | Freq: Four times a day (QID) | ORAL | Status: DC | PRN

## 2013-04-05 MED ORDER — ACETAMINOPHEN 650 MG RE SUPP: 650.0000 mg | RECTAL | Status: DC | PRN

## 2013-04-05 MED ORDER — MORPHINE SULFATE (PF) 1 MG/ML IV SOLN
INTRAVENOUS | Status: DC
  Administered 2013-04-05: 18:00:00 via INTRAVENOUS
  Administered 2013-04-05: 4.5 mg via INTRAVENOUS
  Administered 2013-04-06: 17:00:00 via INTRAVENOUS
  Administered 2013-04-06: 3 mg via INTRAVENOUS
  Administered 2013-04-06: 1.5 mg via INTRAVENOUS
  Administered 2013-04-06: 4.5 mg via INTRAVENOUS
  Administered 2013-04-06 – 2013-04-07 (×3): 3 mg via INTRAVENOUS
  Administered 2013-04-07 (×2): 1.5 mL via INTRAVENOUS
  Filled 2013-04-05: qty 25

## 2013-04-05 MED ORDER — DIVALPROEX SODIUM ER 500 MG PO TB24
500.0000 mg | ORAL_TABLET | Freq: Two times a day (BID) | ORAL | Status: DC
  Administered 2013-04-05 – 2013-04-07 (×4): 500 mg via ORAL
  Filled 2013-04-05 (×5): qty 1

## 2013-04-05 MED ORDER — BUPIVACAINE LIPOSOME 1.3 % IJ SUSP
INTRAMUSCULAR | Status: DC | PRN
  Administered 2013-04-05: 20 mL

## 2013-04-05 MED ORDER — NALOXONE HCL 0.4 MG/ML IJ SOLN: 0.4000 mg | INTRAMUSCULAR | Status: DC | PRN

## 2013-04-05 MED ORDER — SODIUM CHLORIDE 0.9 % IJ SOLN: 9.0000 mL | INTRAMUSCULAR | Status: DC | PRN

## 2013-04-05 MED ORDER — ACETAMINOPHEN 325 MG PO TABS: 650.0000 mg | ORAL_TABLET | ORAL | Status: DC | PRN

## 2013-04-05 MED ORDER — DIPHENHYDRAMINE HCL 50 MG/ML IJ SOLN: 12.5000 mg | Freq: Four times a day (QID) | INTRAMUSCULAR | Status: DC | PRN

## 2013-04-05 MED ORDER — OXYCODONE HCL 5 MG/5ML PO SOLN: 5.0000 mg | Freq: Once | ORAL | Status: DC | PRN

## 2013-04-05 MED ORDER — ONDANSETRON HCL 4 MG/2ML IJ SOLN: 4.0000 mg | Freq: Once | INTRAMUSCULAR | Status: DC | PRN

## 2013-04-05 SURGICAL SUPPLY — 74 items
BENZOIN TINCTURE PRP APPL 2/3 (GAUZE/BANDAGES/DRESSINGS) ×2 IMPLANT
BLADE SURG ROTATE 9660 (MISCELLANEOUS) IMPLANT
BUR ACORN 6.0 (BURR) ×2 IMPLANT
BUR MATCHSTICK NEURO 3.0 LAGG (BURR) ×2 IMPLANT
CANISTER SUCT 3000ML (MISCELLANEOUS) ×2 IMPLANT
CAP REVERE LOCKING (Cap) ×8 IMPLANT
CONN CROSSLINK REV 6.35 48-60 (Connector) ×2 IMPLANT
CONNECTOR CRSLNK REV6.35 48-60 (Connector) ×1 IMPLANT
CONT SPEC 4OZ CLIKSEAL STRL BL (MISCELLANEOUS) ×2 IMPLANT
COVER BACK TABLE 24X17X13 BIG (DRAPES) IMPLANT
COVER TABLE BACK 60X90 (DRAPES) ×2 IMPLANT
DRAPE C-ARM 42X72 X-RAY (DRAPES) ×4 IMPLANT
DRAPE LAPAROTOMY 100X72X124 (DRAPES) ×2 IMPLANT
DRAPE POUCH INSTRU U-SHP 10X18 (DRAPES) ×2 IMPLANT
DRSG OPSITE POSTOP 4X8 (GAUZE/BANDAGES/DRESSINGS) ×4 IMPLANT
DRSG PAD ABDOMINAL 8X10 ST (GAUZE/BANDAGES/DRESSINGS) ×2 IMPLANT
DURAPREP 26ML APPLICATOR (WOUND CARE) ×2 IMPLANT
ELECT BLADE 4.0 EZ CLEAN MEGAD (MISCELLANEOUS) ×2
ELECT REM PT RETURN 9FT ADLT (ELECTROSURGICAL) ×2
ELECTRODE BLDE 4.0 EZ CLN MEGD (MISCELLANEOUS) ×1 IMPLANT
ELECTRODE REM PT RTRN 9FT ADLT (ELECTROSURGICAL) ×1 IMPLANT
EVACUATOR 1/8 PVC DRAIN (DRAIN) IMPLANT
EVACUATOR 3/16  PVC DRAIN (DRAIN) ×1
EVACUATOR 3/16 PVC DRAIN (DRAIN) ×1 IMPLANT
GAUZE SPONGE 4X4 16PLY XRAY LF (GAUZE/BANDAGES/DRESSINGS) ×2 IMPLANT
GLOVE BIO SURGEON STRL SZ 6.5 (GLOVE) ×2 IMPLANT
GLOVE BIOGEL M 8.0 STRL (GLOVE) ×2 IMPLANT
GLOVE ECLIPSE 7.5 STRL STRAW (GLOVE) ×2 IMPLANT
GLOVE ECLIPSE 8.5 STRL (GLOVE) ×2 IMPLANT
GLOVE EXAM NITRILE LRG STRL (GLOVE) ×4 IMPLANT
GLOVE EXAM NITRILE MD LF STRL (GLOVE) IMPLANT
GLOVE EXAM NITRILE XL STR (GLOVE) IMPLANT
GLOVE EXAM NITRILE XS STR PU (GLOVE) IMPLANT
GLOVE INDICATOR 7.0 STRL GRN (GLOVE) ×2 IMPLANT
GLOVE INDICATOR 7.5 STRL GRN (GLOVE) ×8 IMPLANT
GLOVE INDICATOR 8.5 STRL (GLOVE) ×2 IMPLANT
GOWN BRE IMP SLV AUR LG STRL (GOWN DISPOSABLE) ×4 IMPLANT
GOWN BRE IMP SLV AUR XL STRL (GOWN DISPOSABLE) ×2 IMPLANT
GOWN STRL REIN 2XL LVL4 (GOWN DISPOSABLE) ×4 IMPLANT
KIT BASIN OR (CUSTOM PROCEDURE TRAY) ×2 IMPLANT
KIT INFUSE MEDIUM (Orthopedic Implant) ×2 IMPLANT
KIT ROOM TURNOVER OR (KITS) ×2 IMPLANT
MILL MEDIUM DISP (BLADE) ×2 IMPLANT
NEEDLE HYPO 18GX1.5 BLUNT FILL (NEEDLE) IMPLANT
NEEDLE HYPO 21X1.5 SAFETY (NEEDLE) ×2 IMPLANT
NEEDLE HYPO 25X1 1.5 SAFETY (NEEDLE) IMPLANT
NEEDLE SPNL 20GX3.5 QUINCKE YW (NEEDLE) ×2 IMPLANT
NS IRRIG 1000ML POUR BTL (IV SOLUTION) ×2 IMPLANT
PACK LAMINECTOMY NEURO (CUSTOM PROCEDURE TRAY) ×2 IMPLANT
PAD ABD 8X10 STRL (GAUZE/BANDAGES/DRESSINGS) IMPLANT
PAD ARMBOARD 7.5X6 YLW CONV (MISCELLANEOUS) ×6 IMPLANT
PATTIES SURGICAL .5 X1 (DISPOSABLE) ×2 IMPLANT
PATTIES SURGICAL .5 X3 (DISPOSABLE) IMPLANT
ROD REVERE 6.35 40MM (Rod) ×4 IMPLANT
SCREW REVERE 5.5X50 (Screw) ×4 IMPLANT
SCREW REVERE 6.5X50MM (Screw) ×4 IMPLANT
SPACER SUSTAIN O SML 8X22 8MM (Spacer) ×4 IMPLANT
SPONGE GAUZE 4X4 12PLY (GAUZE/BANDAGES/DRESSINGS) ×2 IMPLANT
SPONGE LAP 4X18 X RAY DECT (DISPOSABLE) IMPLANT
SPONGE NEURO XRAY DETECT 1X3 (DISPOSABLE) IMPLANT
SPONGE SURGIFOAM ABS GEL 100 (HEMOSTASIS) ×2 IMPLANT
STRIP CLOSURE SKIN 1/2X4 (GAUZE/BANDAGES/DRESSINGS) ×2 IMPLANT
SUT VIC AB 1 CT1 18XBRD ANBCTR (SUTURE) ×2 IMPLANT
SUT VIC AB 1 CT1 8-18 (SUTURE) ×2
SUT VIC AB 2-0 CP2 18 (SUTURE) ×2 IMPLANT
SUT VIC AB 3-0 SH 8-18 (SUTURE) ×4 IMPLANT
SYR 20CC LL (SYRINGE) ×2 IMPLANT
SYR 20ML ECCENTRIC (SYRINGE) ×2 IMPLANT
SYR 5ML LL (SYRINGE) IMPLANT
TOWEL OR 17X24 6PK STRL BLUE (TOWEL DISPOSABLE) ×2 IMPLANT
TOWEL OR 17X26 10 PK STRL BLUE (TOWEL DISPOSABLE) ×2 IMPLANT
TRAY FOLEY CATH 14FRSI W/METER (CATHETERS) IMPLANT
TRAY FOLEY CATH 16FRSI W/METER (SET/KITS/TRAYS/PACK) ×2 IMPLANT
WATER STERILE IRR 1000ML POUR (IV SOLUTION) ×2 IMPLANT

## 2013-04-05 NOTE — Anesthesia Postprocedure Evaluation (Signed)
  Anesthesia Post-op Note  Patient: Christopher Gilbert  Procedure(s) Performed: Procedure(s): POSTERIOR LUMBAR INTERBODY FUSION LUMBAR FIVE SACRAL ONE  (N/A)  Patient Location: PACU  Anesthesia Type:General  Level of Consciousness: awake  Airway and Oxygen Therapy: Patient Spontanous Breathing  Post-op Pain: mild  Post-op Assessment: Post-op Vital signs reviewed  Post-op Vital Signs: Reviewed  Complications: No apparent anesthesia complications

## 2013-04-05 NOTE — Progress Notes (Signed)
Call to Dr. Noreene Larsson, Neuro anesth. Today, reviewed that pt. Was seen in ED in the past 10 days & advised to follow up with cardiac, although pt. Admits that he did not. Pt. Has PCP, Dr. Abigail Miyamoto, with [facility]. Last visit in Oct. 2014 for a "bug" .

## 2013-04-05 NOTE — Transfer of Care (Signed)
Immediate Anesthesia Transfer of Care Note  Patient: Christopher Gilbert  Procedure(s) Performed: Procedure(s): POSTERIOR LUMBAR INTERBODY FUSION LUMBAR FIVE SACRAL ONE  (N/A)  Patient Location: PACU  Anesthesia Type:General  Level of Consciousness: awake and alert   Airway & Oxygen Therapy: Patient Spontanous Breathing and Patient connected to face mask oxygen  Post-op Assessment: Report given to PACU RN  Post vital signs: Reviewed and stable  Complications: No apparent anesthesia complications

## 2013-04-05 NOTE — Anesthesia Preprocedure Evaluation (Signed)
Anesthesia Evaluation  Patient identified by MRN, date of birth, ID band Patient awake    Reviewed: Allergy & Precautions, H&P , NPO status , Patient's Chart, lab work & pertinent test results  Airway Mallampati: II TM Distance: >3 FB Neck ROM: Full    Dental  (+) Teeth Intact and Dental Advisory Given   Pulmonary Current Smoker,  breath sounds clear to auscultation        Cardiovascular Rhythm:Regular Rate:Normal     Neuro/Psych    GI/Hepatic   Endo/Other    Renal/GU      Musculoskeletal   Abdominal (+) + obese,   Peds  Hematology   Anesthesia Other Findings   Reproductive/Obstetrics                           Anesthesia Physical Anesthesia Plan  ASA: II  Anesthesia Plan: General   Post-op Pain Management:    Induction: Intravenous  Airway Management Planned: Oral ETT  Additional Equipment:   Intra-op Plan:   Post-operative Plan: Extubation in OR  Informed Consent: I have reviewed the patients History and Physical, chart, labs and discussed the procedure including the risks, benefits and alternatives for the proposed anesthesia with the patient or authorized representative who has indicated his/her understanding and acceptance.   Dental advisory given  Plan Discussed with: CRNA and Anesthesiologist  Anesthesia Plan Comments: (Spondylolisthesis with pars defect and HNP L5-S1 Seizure disorder well controlled with meds RBBB on ECG, pt clinically stable with no symptoms of cardiac disease)        Anesthesia Quick Evaluation

## 2013-04-05 NOTE — Plan of Care (Signed)
Problem: Consults Goal: Diagnosis - Spinal Surgery Outcome: Completed/Met Date Met:  04/05/13 L5-S1 PLIF

## 2013-04-05 NOTE — Progress Notes (Signed)
Op note [phone]

## 2013-04-06 NOTE — Op Note (Signed)
NAMEGROVER, ROBINSON NO.:  [id number]  MEDICAL RECORD NO.:  [id number]  LOCATION:  [id number]                        FACILITY:  [facility]  PHYSICIAN:  Hilda Lias, M.D.   DATE OF BIRTH:  11/01/1985  DATE OF PROCEDURE:  04/05/2013 DATE OF DISCHARGE:                              OPERATIVE REPORT   PREOPERATIVE DIAGNOSIS:  L5-S1 grade 2 spondylolisthesis with chronic and acute radiculopathy.  POSTOPERATIVE DIAGNOSIS:  L5-S1 grade 2 spondylolisthesis with chronic and acute radiculopathy.  PROCEDURE:  Removal of the lamina of L5 and the facet.  Lysis of adhesion.  Bilateral L5-S1 diskectomy more than normal to be able to insert the cages.  Insertion of 2 cages, 8 x 22.  Pedicle screws L5-S1. Posterolateral arthrodesis with autograft and BMP.  ACTIVITY:  Hilda Lias, MD.  ASSISTANT:  Dr. Stefani Dama.  CLINICAL HISTORY:  Mr. 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 Several months ago, patient had a sudden onset of pain going to the left leg.  He has weakness of the left foot.  We did an epidural injection and the pain improved.  Nevertheless for the past few weeks, he had been complaining of worsening of the pain associated with up to 1/5 weakness of the left foot with numbness which involved the dorsum of the left foot.  MRI showed that he has had grade 2 spondylolisthesis at the level of L5-S1 and some retrolisthesis at the L4-5 with no movement.  Patient had an epidural injection and did not help.  Because of that, he had agreed with surgery.  Mr. Caba knew the risk with the surgery such as no improvement whatsoever, CSF leak, hematoma, no improve with the weakness up to the point worsening of the weakness.  The x-ray was seen with several of my partners and all of [place] agreed with the decision about surgery.  PROCEDURE:  Patient was taken to the OR and he was intubated.  He  was positioned in a prone manner and the back was cleaned with Betadine and DuraPrep.  Drapes were applied.  Midline incision was made from L4-L5 down to L5-S1.  Muscles were retracted all the way laterally, until we were able to see and feel the lateral aspect of the L5-S1.  X-rays were taken which showed that indeed we were right at the level of L5-1. Immediately, we proceeded with removal of spinous process of L5, as well as the lamina.  We went laterally and we were able to do a facetectomy. What we found immediately, patient has a lot of fibrosis, compromising mostly the L5 nerve root.  Patient had quite a bit of stenosis. Decompression of the L5 nerve root was done.  We found that the left nerve root was swollen and bigger than the one in the right side. Having good decompression of the thecal sac as well as the L5 and S1 nerve root, we entered the disk space.  It took [place] quite a while to be able to diskectomy because of the narrowing of the disk space.  Finally, we were able to total gross diskectomy,  more than normal.  The endplates were removed and 2 cages of 8 x 22 with autograft and BMP were inserted. Lateral x-rays showed good position of the cages.  From then on, using the C-arm in the AP and lateral view, we probed the pedicle of L5 and S1.  Four screws were inserted.  At the level of S1, it was 6.5 x 50 and at the level of L5 was 5.5 x 50.  Prior to introduction of the screws, we feel the hole just to be sure that we were surrounded by bone.  Then, the screw were connected with a rod and caps.  A cross-link was also performed.  We went laterally and we removed the periosteum of the lateral aspect of the L5 and the ala of the sacrum.  A mix of BMP and autograft was used for arthrodesis.  The area was irrigated.  A Valsalva maneuver was negative twice.  A drain was inserted in the epidural space and the wound was closed with Vicryl and Steri-Strips.           ______________________________ Hilda Lias, M.D.     EB/MEDQ  D:  04/05/2013  T:  04/06/2013  Job:  [id number]

## 2013-04-06 NOTE — Progress Notes (Signed)
Patient ID: Christopher Gilbert, male   DOB: [birth year], 27 y.o.   MRN: [id number] BP 138/75  Pulse 72  Temp(Src) 98 F (36.7 C) (Oral)  Resp 18  Ht 5' 8.5" (1.74 m)  Wt 95.1 kg (209 lb 10.5 oz)  BMI 31.41 kg/m2  SpO2 99% Alert and oriented x 4 Moving lower extremities well Wound is clean, dry, no signs of infection Drain to remain in place Will keep PCA

## 2013-04-06 NOTE — Evaluation (Signed)
Physical Therapy Evaluation Patient Details Name: Christopher Gilbert MRN: [id number] DOB: [birth year] Today's Date: 04/06/2013 Time: 7829-5621 PT Time Calculation (min): 28 min  PT Assessment / Plan / Recommendation History of Present Illness   Christopher Gilbert works with [place] at [employer]. Christopher Gilbert has a history of lumbar pain with radiation to the lower extremities. Few months ago Christopher Gilbert had an epidutal injection by DR  Ollen Bowl and a lumbar corset was given. Nevertheless the pain is getting worse, with radiation to the left leg. Second epidural did not help and now Christopher Gilbert has been having off and on difficulties voiding. Mri lumbar was done.  Prodeeding to anticipated L5S1 fusion.   Clinical Impression  Expect quick progress as Christopher Gilbert is already doing quite well, though Christopher Gilbert expresses radiculating pain down back of both legs at pain level of 5/10 with meds.    PT Assessment  Patient needs continued PT services    Follow Up Recommendations  No PT follow up;Supervision/Assistance - 24 hour;Other (comment) (for short period)    Does the patient have the potential to tolerate intense rehabilitation      Barriers to Discharge        Equipment Recommendations  Other (comment) (may need RW, but not expected)    Recommendations for Other Services     Frequency Min 5X/week    Precautions / Restrictions Precautions Precautions: Back Required Braces or Orthoses: Spinal Brace Spinal Brace: Lumbar corset   Pertinent Vitals/Pain 5/10 with meds      Mobility  Bed Mobility Bed Mobility: Rolling Left;Left Sidelying to Sit;Sit to Sidelying Left Rolling Left: 5: Supervision Left Sidelying to Sit: 5: Supervision Sit to Sidelying Left: 5: Supervision Details for Bed Mobility Assistance: cues for safe technique and practice Transfers Transfers: Sit to Stand;Stand to Sit Sit to Stand: 5: Supervision Stand to Sit: 5: Supervision Details for Transfer Assistance: cues for safety Ambulation/Gait Ambulation/Gait  Assistance: 5: Supervision Ambulation Distance (Feet): 200 Feet Assistive device: Rolling walker Ambulation/Gait Assistance Details: guarded gait with mildly stooped posture Gait Pattern: Step-through pattern Gait velocity: slowed Stairs: No    Exercises     PT Diagnosis: Difficulty walking;Acute pain  PT Problem List: Decreased strength;Decreased mobility;Decreased knowledge of use of DME;Decreased knowledge of precautions;Pain PT Treatment Interventions: DME instruction;Gait training;Stair training;Functional mobility training;Patient/family education     PT Goals(Current goals can be found in the care plan section) Acute Rehab PT Goals Patient Stated Goal: eventually back to work PT Goal Formulation: With patient Time For Goal Achievement: 04/13/13 Potential to Achieve Goals: Good  Visit Information  Last PT Received On: 04/13/13 Assistance Needed: +1 History of Present Illness:  Christopher Gilbert works with [place] at [employer]. Christopher Gilbert has a history of lumbar pain with radiation to the lower extremities. Few months ago Christopher Gilbert had an epidutal injection by DR  Ollen Bowl and a lumbar corset was given. Nevertheless the pain is getting worse, with radiation to the left leg. Second epidural did not help and now Christopher Gilbert has been having off and on difficulties voiding. Mri lumbar was done.  Prodeeding to anticipated L5S1 fusion.        Prior Functioning  Home Living Family/patient expects to be discharged to:: Private residence Living Arrangements: Spouse/significant other Available Help at Discharge: Family Type of Home: Apartment Home Access: Stairs to enter Secretary/administrator of Steps: 13 Entrance Stairs-Rails: Right;Left Home Layout: One level Home Equipment: Other (comment) (TBA) Prior Function Level of Independence: Independent Communication Communication: No difficulties    Cognition  Cognition Arousal/Alertness: Awake/alert Behavior During Therapy: WFL for tasks  assessed/performed Overall Cognitive Status: Within Functional Limits for tasks assessed    Extremity/Trunk Assessment Upper Extremity Assessment Upper Extremity Assessment: Overall WFL for tasks assessed Lower Extremity Assessment Lower Extremity Assessment: Overall WFL for tasks assessed;RLE deficits/detail;LLE deficits/detail RLE Deficits / Details: bil pain posterior legs Cervical / Trunk Assessment Cervical / Trunk Assessment: Normal   Balance Balance Balance Assessed: No  End of Session PT - End of Session Equipment Utilized During Treatment: Back brace Activity Tolerance: Patient tolerated treatment well Patient left: in bed;with call bell/phone within reach;with family/visitor present Nurse Communication: Mobility status  GP     Christopher Gilbert, [name] 04/06/2013, 12:54 PM 04/06/2013  Robinette Bing, PT [phone]  (pager)

## 2013-04-06 NOTE — Evaluation (Signed)
Occupational Therapy Evaluation Patient Details Name: Christopher Gilbert MRN: [id number] DOB: Jul 01, 1985 Today's Date: 04/06/2013 Time: 4098-1191 OT Time Calculation (min): 25 min  OT Assessment / Plan / Recommendation History of present illness Pt admitted for L5-S1 fusion    Clinical Impression   Pt admitted with above.  He demonstrates the below listed deficits and will benefit from continued OT to maximize safety and independence with BADLs.  He is moving very well POD #1 and is very motivated.  Currently, he requires supervision with occasional min guard assist due to 2 LOB.    OT Assessment  Patient needs continued OT Services    Follow Up Recommendations  No OT follow up;Supervision - Intermittent    Barriers to Discharge      Equipment Recommendations  3 in 1 bedside comode    Recommendations for Other Services    Frequency  Min 2X/week    Precautions / Restrictions Precautions Precautions: Back Required Braces or Orthoses: Spinal Brace Spinal Brace: Lumbar corset   Pertinent Vitals/Pain     ADL  Eating/Feeding: Independent Where Assessed - Eating/Feeding: Chair Grooming: Wash/dry hands;Wash/dry face;Teeth care;Supervision/safety Where Assessed - Grooming: Supported standing Upper Body Bathing: Set up;Supervision/safety Where Assessed - Upper Body Bathing: Unsupported sitting Lower Body Bathing: Minimal assistance Where Assessed - Lower Body Bathing: Unsupported sit to stand Upper Body Dressing: Supervision/safety Where Assessed - Upper Body Dressing: Unsupported sitting Lower Body Dressing: Supervision/safety Where Assessed - Lower Body Dressing: Unsupported sit to stand Toilet Transfer: Supervision/safety Toilet Transfer Method: Sit to stand;Stand pivot Toilet Transfer Equipment: Raised toilet seat with arms (or 3-in-1 over toilet) Toileting - Clothing Manipulation and Hygiene: Supervision/safety Where Assessed - Engineer, mining and  Hygiene: Standing Tub/Shower Transfer: Supervision/safety Tub/Shower Transfer Method: Ambulating Equipment Used: Back brace Transfers/Ambulation Related to ADLs: Pt ambulated with supervision to min guard due to occasional LOB ADL Comments: Pt instructed in back precautions; how to safely perform LB ADLs.  Pt is able to cross ankles over knees to don/doff socks.  Discussed with him the use of LH sponge/brush for showering, and the use of reacher and where to purchase.  Pt also instructed in safe technique for grooming while standing at sink to avoid bending.  Discussed options for toileting as his commode is low and he has no sink next to it to support self with sit to stand.  Pt does not want 3in1, but realizes, he likely is not going to be able to perform toilet transfer safely without it.     OT Diagnosis: Generalized weakness;Acute pain  OT Problem List: Decreased strength;Decreased knowledge of use of DME or AE;Decreased knowledge of precautions;Pain OT Treatment Interventions: Self-care/ADL training;DME and/or AE instruction;Therapeutic activities;Patient/family education   OT Goals(Current goals can be found in the care plan section) Acute Rehab OT Goals Patient Stated Goal: eventually back to work OT Goal Formulation: With patient Time For Goal Achievement: 04/13/13 Potential to Achieve Goals: Good ADL Goals Pt Will Perform Grooming: with modified independence;standing Pt Will Perform Lower Body Bathing: with modified independence;sit to/from stand Pt Will Perform Lower Body Dressing: with modified independence;sit to/from stand Pt Will Transfer to Toilet: with modified independence;ambulating;regular height toilet;bedside commode Pt Will Perform Toileting - Clothing Manipulation and hygiene: with modified independence;sit to/from stand Additional ADL Goal #1: Pt will be independent with back precautions during all BADLs  Visit Information  Last OT Received On: 04/06/13 Assistance  Needed: +1 History of Present Illness: Pt admitted for L5-S1 fusion  Prior Functioning     Home Living Family/patient expects to be discharged to:: Private residence Living Arrangements: Spouse/significant other Available Help at Discharge: Family Type of Home: Apartment Home Access: Stairs to enter Secretary/administrator of Steps: 13 Entrance Stairs-Rails: Right;Left Home Layout: One level Home Equipment: Other (comment) (TBA) Prior Function Level of Independence: Independent Communication Communication: No difficulties         Vision/Perception     Cognition  Cognition Arousal/Alertness: Awake/alert Behavior During Therapy: WFL for tasks assessed/performed Overall Cognitive Status: Within Functional Limits for tasks assessed    Extremity/Trunk Assessment Upper Extremity Assessment Upper Extremity Assessment: Overall WFL for tasks assessed Lower Extremity Assessment Lower Extremity Assessment: Defer to PT evaluation Cervical / Trunk Assessment Cervical / Trunk Assessment: Normal     Mobility Bed Mobility Bed Mobility: Sit to Sidelying Left Sit to Sidelying Left: 5: Supervision Details for Bed Mobility Assistance: Pt able to perform safely  Transfers Transfers: Sit to Stand;Stand to Sit Sit to Stand: 5: Supervision Stand to Sit: 5: Supervision     Exercise     Balance     End of Session OT - End of Session Equipment Utilized During Treatment: Back brace Activity Tolerance: Patient tolerated treatment well Patient left: in bed;with call bell/phone within reach;with family/visitor present Nurse Communication: Mobility status  GO     Prayan Ulin M 04/06/2013, 5:53 PM

## 2013-04-06 NOTE — Progress Notes (Signed)
Patient ID: Christopher Gilbert, male   DOB: 09-Nov-1985, 27 y.o.   MRN: [id number] Doing well, no leg pain as preop. Some drainage in the hemovac. Ambulating.

## 2013-04-07 MED ORDER — PROMETHAZINE HCL 25 MG PO TABS: 12.5000 mg | ORAL_TABLET | ORAL | Status: DC | PRN

## 2013-04-07 MED ORDER — DIVALPROEX SODIUM ER 500 MG PO TB24
750.0000 mg | ORAL_TABLET | Freq: Two times a day (BID) | ORAL | Status: DC
  Administered 2013-04-07 – 2013-04-08 (×2): 750 mg via ORAL
  Filled 2013-04-07 (×3): qty 1

## 2013-04-07 MED ORDER — HYDROCODONE-ACETAMINOPHEN 10-325 MG PO TABS
1.0000 | ORAL_TABLET | ORAL | Status: DC | PRN
  Administered 2013-04-07: 1 via ORAL
  Filled 2013-04-07: qty 1

## 2013-04-07 MED ORDER — MORPHINE SULFATE 2 MG/ML IJ SOLN: 2.0000 mg | INTRAMUSCULAR | Status: DC | PRN

## 2013-04-07 MED ORDER — PROMETHAZINE HCL 25 MG/ML IJ SOLN: 12.5000 mg | INTRAMUSCULAR | Status: DC | PRN

## 2013-04-07 NOTE — Progress Notes (Signed)
Doing well. C/o appropriate incisional soreness.  Amb/ voiding well  Temp:  [97.8 F (36.6 C)-98.5 F (36.9 C)] 98.2 F (36.8 C) (12/28 0534) Pulse Rate:  [69-85] 83 (12/28 0534) Resp:  [16-20] 20 (12/28 0534) BP: (126-141)/(54-74) 140/73 mmHg (12/28 0534) SpO2:  [96 %-99 %] 97 % (12/28 0534) Good strength and sensation Incision CDI  Plan: Still sig drainage in drain, leave another day  - increase activity -

## 2013-04-07 NOTE — Progress Notes (Signed)
Occupational Therapy Treatment Patient Details Name: Christopher Gilbert MRN: [id number] DOB: 1985-11-03 Today's Date: 04/07/2013 Time: 1478-2956 OT Time Calculation (min): 24 min  OT Assessment / Plan / Recommendation  History of present illness Pt admitted for L5-S1 fusion    OT comments  Pt progressing toward goals.  Overall supervision level with ADLs and functional mobility using RW.  Will practice tub transfer next session.  Follow Up Recommendations  No OT follow up;Supervision - Intermittent    Barriers to Discharge       Equipment Recommendations  3 in 1 bedside comode    Recommendations for Other Services    Frequency Min 2X/week   Progress towards OT Goals Progress towards OT goals: Progressing toward goals  Plan Discharge plan remains appropriate    Precautions / Restrictions Precautions Precautions: Back Precaution Comments: Pt able to state 3/3 back precautions. Required Braces or Orthoses: Spinal Brace Spinal Brace: Lumbar corset   Pertinent Vitals/Pain See vitals    ADL  Eating/Feeding: Performed;Independent Where Assessed - Eating/Feeding: Chair Lower Body Dressing: Performed;Supervision/safety Where Assessed - Lower Body Dressing: Unsupported sit to stand Toilet Transfer: Performed;Supervision/safety Toilet Transfer Method: Sit to Barista:  (bed, chair) Equipment Used: Back brace;Rolling walker Transfers/Ambulation Related to ADLs: Initially min guard without DME but pt slightly unsteady due LE pain. Supervision with RW.  ADL Comments: Reviewed back precautions with pt.  Pt able to don shorts with supervision using technique of crossing ankles over legs.  Supervision needed for safety though due to pt still somewhat stiff and sore, unsteady without RW.  Educated pt on home safety and techniques for ADLs/IADLs to assist with maintaining back precautions.  Education provided while ambulating around unit (pt reports walking seems to  help with his pain).     OT Diagnosis:    OT Problem List:   OT Treatment Interventions:     OT Goals(current goals can now be found in the care plan section) Acute Rehab OT Goals Patient Stated Goal: eventually back to work OT Goal Formulation: With patient Time For Goal Achievement: 04/13/13 Potential to Achieve Goals: Good ADL Goals Pt Will Perform Grooming: with modified independence;standing Pt Will Perform Lower Body Bathing: with modified independence;sit to/from stand Pt Will Perform Lower Body Dressing: with modified independence;sit to/from stand Pt Will Transfer to Toilet: with modified independence;ambulating;regular height toilet;bedside commode Pt Will Perform Toileting - Clothing Manipulation and hygiene: with modified independence;sit to/from stand Additional ADL Goal #1: Pt will be independent with back precautions during all BADLs  Visit Information  Last OT Received On: 04/07/13 Assistance Needed: +1 History of Present Illness: Pt admitted for L5-S1 fusion     Subjective Data      Prior Functioning       Cognition  Cognition Arousal/Alertness: Awake/alert Behavior During Therapy: WFL for tasks assessed/performed Overall Cognitive Status: Within Functional Limits for tasks assessed    Mobility  Bed Mobility Bed Mobility: Rolling Left;Left Sidelying to Sit;Sitting - Scoot to Edge of Bed Rolling Left: 5: Supervision Left Sidelying to Sit: 5: Supervision Sitting - Scoot to Edge of Bed: 5: Supervision Details for Bed Mobility Assistance: Incr time due to pain. Transfers Transfers: Sit to Stand;Stand to Sit Sit to Stand: 5: Supervision;From bed;With upper extremity assist Stand to Sit: 5: Supervision;To chair/3-in-1;With upper extremity assist;With armrests Details for Transfer Assistance: Supervision for safety. No physical assist needed.    Exercises      Balance     End of Session OT - End  of Session Equipment Utilized During Treatment: Back  brace;Rolling walker Activity Tolerance: Patient tolerated treatment well Patient left: in chair;with call bell/phone within reach Nurse Communication: Mobility status  GO    04/07/2013 Cipriano Mile OTR/L Pager [phone] Office [phone]  Cipriano Mile 04/07/2013, 10:14 AM

## 2013-04-07 NOTE — Progress Notes (Signed)
Physical Therapy Treatment Patient Details Name: Christopher Gilbert MRN: [id number] DOB: Aug 12, 1985 Today's Date: 04/07/2013 Time: 4098-1191 PT Time Calculation (min): 13 min  PT Assessment / Plan / Recommendation  History of Present Illness     PT Comments   Pt with possible d/c home tomorrow.  Needs to practice ambulation without RW to determine if RW needed for home.  Follow Up Recommendations  No PT follow up;Supervision/Assistance - 24 hour     Does the patient have the potential to tolerate intense rehabilitation     Barriers to Discharge        Equipment Recommendations  Other (comment) (may need RW, but not expected)    Recommendations for Other Services    Frequency Min 5X/week   Progress towards PT Goals Progress towards PT goals: Progressing toward goals  Plan Current plan remains appropriate    Precautions / Restrictions Precautions Precautions: Back Precaution Comments: Pt independently recalled 3/3 precautions. Required Braces or Orthoses: Spinal Brace Spinal Brace: Lumbar corset;Applied in sitting position   Pertinent Vitals/Pain 4/10    Mobility  Bed Mobility Sit to Sidelying Left: 5: Supervision Transfers Stand to Sit: 5: Supervision;To bed;With upper extremity assist Ambulation/Gait Ambulation/Gait Assistance: 5: Supervision Ambulation Distance (Feet): 500 Feet Assistive device: Rolling walker Ambulation/Gait Assistance Details: highly reliant on RW with increased trunk flexion Gait Pattern: Step-through pattern Gait velocity: WFL Stairs: Yes Stairs Assistance: 4: Min guard Stair Management Technique: Two rails;Step to pattern;Forwards Number of Stairs: 5    Exercises     PT Diagnosis:    PT Problem List:   PT Treatment Interventions:     PT Goals (current goals can now be found in the care plan section)    Visit Information  Last PT Received On: 04/07/13 Assistance Needed: +1    Subjective Data      Cognition   Cognition Arousal/Alertness: Awake/alert Behavior During Therapy: WFL for tasks assessed/performed Overall Cognitive Status: Within Functional Limits for tasks assessed    Balance     End of Session PT - End of Session Equipment Utilized During Treatment: Back brace Activity Tolerance: Patient tolerated treatment well Patient left: in bed;with call bell/phone within reach;with nursing/sitter in room;with family/visitor present Nurse Communication: Mobility status   GP     Ilda Foil 04/07/2013, 2:59 PM  Aida Raider, PT  Office # [phone] Pager [phone]

## 2013-04-08 MED ORDER — OXYCODONE-ACETAMINOPHEN 5-325 MG PO TABS: 1.0000 | ORAL_TABLET | Freq: Four times a day (QID) | ORAL | Status: DC | PRN

## 2013-04-08 MED ORDER — DIAZEPAM 5 MG PO TABS: 5.0000 mg | ORAL_TABLET | Freq: Four times a day (QID) | ORAL | Status: DC | PRN

## 2013-04-08 MED FILL — Sodium Chloride IV Soln 0.9%: INTRAVENOUS | Qty: 1000 | Status: AC

## 2013-04-08 MED FILL — Heparin Sodium (Porcine) Inj 1000 Unit/ML: INTRAMUSCULAR | Qty: 30 | Status: AC

## 2013-04-08 NOTE — Progress Notes (Addendum)
Occupational Therapy Treatment Patient Details Name: Christopher Gilbert MRN: [id number] DOB: 1985/11/06 Today's Date: 04/08/2013 Time: 9323-5573 OT Time Calculation (min): 13 min  OT Assessment / Plan / Recommendation  History of present illness Pt admitted for L5-S1 fusion    OT comments  Practiced tub transfer. Provided education to pt.   Follow Up Recommendations  No OT follow up;Supervision/Assistance - 24 hour    Barriers to Discharge       Equipment Recommendations  3 in 1 bedside comode    Recommendations for Other Services    Frequency Min 2X/week   Progress towards OT Goals Progress towards OT goals: Progressing toward goals  Plan Discharge plan needs to be updated    Precautions / Restrictions Precautions Precautions: Back Precaution Comments: Pt able to state 2/3 back precautions. Required Braces or Orthoses: Spinal Brace Spinal Brace: Lumbar corset;Applied in sitting position Restrictions Weight Bearing Restrictions: No   Pertinent Vitals/Pain Pain 3-4/10. Nurse notified.     ADL  Grooming: Supervision/safety;Teeth care Where Assessed - Grooming: Supported standing Upper Body Dressing: Set up;Supervision/safety (back brace) Where Assessed - Upper Body Dressing: Unsupported sitting Toilet Transfer: Supervision/safety Toilet Transfer Method: Sit to Barista: Bedside commode Tub/Shower Transfer: Min guard Tub/Shower Transfer Method: Science writer: Other (comment) (practiced stepping over) Equipment Used: Back brace;Rolling walker;Gait belt Transfers/Ambulation Related to ADLs: Supervision/Min guard for ambulation and Supervision for sit <> stand transfers. Min guard for tub transfer. ADL Comments: Educated on use of two cups for teeth care and to have items on right side of sink to avoid breaking precautions. Cues for precautions during grooming task. Educated on safe shoewear and to have someone with him  when getting in tub. Educated on use of toilet aid for hygiene. Spoke with pt about use of bag on walker to carry items.  Recommended sitting to bathe.    OT Diagnosis:    OT Problem List:   OT Treatment Interventions:     OT Goals(current goals can now be found in the care plan section) Acute Rehab OT Goals Patient Stated Goal: not stated OT Goal Formulation: With patient Time For Goal Achievement: 04/13/13 Potential to Achieve Goals: Good ADL Goals Pt Will Perform Grooming: with modified independence;standing Pt Will Perform Lower Body Bathing: with modified independence;sit to/from stand Pt Will Perform Lower Body Dressing: with modified independence;sit to/from stand Pt Will Transfer to Toilet: with modified independence;ambulating;regular height toilet;bedside commode Pt Will Perform Toileting - Clothing Manipulation and hygiene: with modified independence;sit to/from stand Pt Will Perform Tub/Shower Transfer: Tub transfer;with supervision;ambulating Additional ADL Goal #1: Pt will be independent with back precautions during all BADLs  Visit Information  Last OT Received On: 04/08/13 Assistance Needed: +1 History of Present Illness: Pt admitted for L5-S1 fusion     Subjective Data      Prior Functioning       Cognition  Cognition Arousal/Alertness: Awake/alert Behavior During Therapy: WFL for tasks assessed/performed Overall Cognitive Status: Within Functional Limits for tasks assessed    Mobility  Bed Mobility Bed Mobility: Rolling Left;Left Sidelying to Sit Rolling Left: 5: Supervision Left Sidelying to Sit: 5: Supervision Details for Bed Mobility Assistance: Cues for technique and precautions. Transfers Transfers: Sit to Stand;Stand to Sit Sit to Stand: 5: Supervision;With upper extremity assist;From chair/3-in-1;From bed Stand to Sit: 5: Supervision;To bed;To chair/3-in-1 Details for Transfer Assistance: Cues for hand placement.    Exercises         End  of Session OT -  End of Session Equipment Utilized During Treatment: Gait belt;Rolling walker;Back brace Activity Tolerance: Patient tolerated treatment well Patient left: in bed;Other (comment) (with PT) Nurse Communication: Other (comment) (pain level)  GO     Earlie Raveling OTR/L [phone] 04/08/2013, 9:52 AM

## 2013-04-08 NOTE — Discharge Summary (Signed)
Physician Discharge Summary  Patient ID: Christopher Gilbert MRN: [id number] DOB/AGE: [birth year] 27 y.o.  Admit date: 04/05/2013 Discharge date: 04/08/2013  Admission Diagnoses: lumbar spondylolisthesis   Discharge Diagnoses: same   Discharged Condition: good  Hospital Course: The patient was admitted on 04/05/2013 and taken to the operating room where the patient underwent PLIF. The patient tolerated the procedure well and was taken to the recovery room and then to the floor in stable condition. The hospital course was routine. There were no complications. The wound remained clean dry and intact. Pt had appropriate back soreness. No complaints of leg pain or new N/T/W. The patient remained afebrile with stable vital signs, and tolerated a regular diet. The patient continued to increase activities, and pain was well controlled with oral pain medications.   Consults: None  Significant Diagnostic Studies:  Results for orders placed during the hospital encounter of 04/02/13  SURGICAL PCR SCREEN      Result Value Range   MRSA, PCR NEGATIVE  NEGATIVE   Staphylococcus aureus NEGATIVE  NEGATIVE  TYPE AND SCREEN      Result Value Range   ABO/RH(D) A POS     Antibody Screen NEG     Sample Expiration 04/16/2013    ABO/RH      Result Value Range   ABO/RH(D) A POS      Dg Chest 2 View  03/25/2013   CLINICAL DATA:  Abdominal pain.  EXAM: CHEST  2 VIEW  COMPARISON:  None.  FINDINGS: The heart size and mediastinal contours are within normal limits. Both lungs are clear. The visualized skeletal structures are unremarkable.  IMPRESSION: No active cardiopulmonary disease.   Electronically Signed   By: Tiburcio Pea M.D.   On: 03/25/2013 00:45   Dg Lumbar Spine 2-3 Views  04/05/2013   CLINICAL DATA:  L5-S1 fusion.  EXAM: LUMBAR SPINE - 2-3 VIEW; DG C-ARM 1-60 MIN  COMPARISON:  Intraoperative views of the lumbar spine earlier this same day.  FINDINGS: Two fluoroscopic intraoperative spot  views demonstrate pedicle screws, stabilization bars and interbody spacer in place at L5-S1. Anterolisthesis of L5 on S1 is again seen. Hardware appears intact. No new abnormality.  IMPRESSION: L5-S1 PLIF.   Electronically Signed   By: Drusilla Kanner M.D.   On: 04/05/2013 17:03   Dg Lumbar Spine 2-3 Views  04/05/2013   CLINICAL DATA:  Lumbar spine a marked evaluation.  EXAM: LUMBAR SPINE - 2-3 VIEW  COMPARISON:  None.  FINDINGS: On image labeled #1 a surgical probe projects along the posterior soft tissues of the mid S1 vertebral body. A 2nd probe projects within the soft tissues along the lower border of the S1 vertebral body.  On image labeled #2 a surgical probe projects within the posterior soft tissues at the L5-S1 level. A 2nd probe projects within the soft tissues along the superior border of S1.  IMPRESSION: PLIF L5-S1   Electronically Signed   By: Salome Holmes M.D.   On: 04/05/2013 17:01   Mr Lumbar Spine Wo Contrast  03/26/2013   CLINICAL DATA:  Severe low back pain with left leg pain  EXAM: MRI LUMBAR SPINE WITHOUT CONTRAST  TECHNIQUE: Multiplanar, multisequence MR imaging was performed. No intravenous contrast was administered.  COMPARISON:  Lumbar radiographs 11/06/2012  FINDINGS: Negative for fracture or mass lesion. Conus medullaris is normal and terminates at L1-2. S1 is a transitional segment with a partially developed disc space at S1-S2.  T11-12: Small right paracentral disc protrusion with associated  spurring. This appears chronic.  T12-L1:  Negative  L1-2:  Negative  L2-3:  Negative  L3-4:  Negative  L4-5: 7 mm of retrolisthesis. There is disc degeneration and spondylosis without significant spinal stenosis. Neural foramina are adequately patent.  L5-S1: 14 mm anterior slip with bilateral pars defects of L5. There is marked foraminal encroachment bilaterally with compression of the L5 nerve roots bilaterally left greater than right. There is severe left foraminal encroachment.   IMPRESSION: 7 mm retrolisthesis L4 on L5 without significant stenosis.  14 mm anterior slip L5-S1 with bilateral pars defects of L5. There is severe foraminal encroachment impingement of the L5 nerve root bilaterally, left greater than right.   Electronically Signed   By: Marlan Palau M.D.   On: 03/26/2013 07:56   US Abdomen Complete  03/25/2013   CLINICAL DATA:  Epigastric pain.  History of gallstones.  EXAM: ULTRASOUND ABDOMEN COMPLETE  COMPARISON:  None.  FINDINGS: Gallbladder:  The gallbladder is focally tender per sonographic exam, but is not dilated and shows no stones. Wall thickness can be accounted for by underdistention.  Common bile duct:  Diameter: 6 mm which is upper normal. No cholestasis on contemporaneously labs.  Liver:  Increased echogenicity.  No focal abnormality.  IVC:  No abnormality visualized.  Pancreas:  Mainly obscured.  Spleen:  Size and appearance within normal limits.  Right Kidney:  Length: 12 cm. Echogenicity within normal limits. No mass or hydronephrosis visualized.  Left Kidney:  Length: 11 cm. Echogenicity within normal limits. No mass or hydronephrosis visualized.  Abdominal aorta:  No aneurysm visualized.  Other findings:  None.  IMPRESSION: 1. Negative for gallstones or acute cholecystitis. 2.  Fatty liver.   Electronically Signed   By: Tiburcio Pea M.D.   On: 03/25/2013 04:12   Dg C-arm 1-60 Min  04/05/2013   CLINICAL DATA:  L5-S1 fusion.  EXAM: LUMBAR SPINE - 2-3 VIEW; DG C-ARM 1-60 MIN  COMPARISON:  Intraoperative views of the lumbar spine earlier this same day.  FINDINGS: Two fluoroscopic intraoperative spot views demonstrate pedicle screws, stabilization bars and interbody spacer in place at L5-S1. Anterolisthesis of L5 on S1 is again seen. Hardware appears intact. No new abnormality.  IMPRESSION: L5-S1 PLIF.   Electronically Signed   By: Drusilla Kanner M.D.   On: 04/05/2013 17:03    Antibiotics:  Anti-infectives   Start     Dose/Rate Route Frequency  Ordered Stop   04/05/13 2000  ceFAZolin (ANCEF) IVPB 1 g/50 mL premix     1 g 100 mL/hr over 30 Minutes Intravenous Every 8 hours 04/05/13 1912 04/06/13 0318   04/05/13 0600  ceFAZolin (ANCEF) IVPB 2 g/50 mL premix     2 g 100 mL/hr over 30 Minutes Intravenous On call to O.R. 04/04/13 1229 04/05/13 1245      Discharge Exam: Blood pressure 112/60, pulse 84, temperature 98 F (36.7 C), temperature source Oral, resp. rate 17, height 5' 8.5" (1.74 m), weight 95.1 kg (209 lb 10.5 oz), SpO2 98.00%. Neurologic: Grossly normal Incision CDI  Discharge Medications:     Medication List         amphetamine-dextroamphetamine 10 MG tablet  Commonly known as:  ADDERALL  Take 1 tablet by mouth daily.     diazepam 5 MG tablet  Commonly known as:  VALIUM  Take 1 tablet (5 mg total) by mouth every 6 (six) hours as needed for muscle spasms.     divalproex 500 MG 24 hr tablet  Commonly known as:  DEPAKOTE ER  Take 1 tablet by mouth 2 (two) times daily.     divalproex 250 MG 24 hr tablet  Commonly known as:  DEPAKOTE ER  Take 250 mg by mouth 2 (two) times daily.     famotidine 20 MG tablet  Commonly known as:  PEPCID  Take 20 mg by mouth 2 (two) times daily as needed.     ondansetron 4 MG tablet  Commonly known as:  ZOFRAN  Take 4 mg by mouth every 8 (eight) hours as needed for nausea or vomiting.     oxyCODONE-acetaminophen 5-325 MG per tablet  Commonly known as:  PERCOCET/ROXICET  Take 1-2 tablets by mouth every 6 (six) hours as needed for severe pain.     pantoprazole 20 MG tablet  Commonly known as:  PROTONIX  Take 20 mg by mouth daily as needed.     predniSONE 20 MG tablet  Commonly known as:  DELTASONE  3 tabs po daily x 3 days, then 2 tabs x 3 days, then 1.5 tabs x 3 days, then 1 tab x 3 days, then 0.5 tabs x 3 days     topiramate 100 MG tablet  Commonly known as:  TOPAMAX  Take 1 tablet by mouth 2 (two) times daily.        Disposition: Home  Final Dx: PLIF  L5-S1       Discharge Orders   Future Orders Complete By Expires   Call MD for:  difficulty breathing, headache or visual disturbances  As directed    Call MD for:  persistant nausea and vomiting  As directed    Call MD for:  redness, tenderness, or signs of infection (pain, swelling, redness, odor or green/yellow discharge around incision site)  As directed    Call MD for:  severe uncontrolled pain  As directed    Call MD for:  temperature >100.4  As directed    Diet - low sodium heart healthy  As directed    Discharge instructions  As directed    Comments:     no bending, twisting, or heavy lifting. May shower. No driving   Increase activity slowly  As directed       Follow-up Information   Follow up with Karn Cassis, MD. Schedule an appointment as soon as possible for a visit in 2 weeks.   Specialty:  Neurosurgery   Contact information:   [address] [place] [address] [phone]        Signed: Tia Alert 04/08/2013, 8:02 AM

## 2013-04-08 NOTE — Care Management Note (Signed)
    Page 1 of 1   04/08/2013     10:50:35 AM   CARE MANAGEMENT NOTE 04/08/2013  Patient:  Christopher Gilbert, Christopher Gilbert   Account Number:  [id number]  Date Initiated:  04/08/2013  Documentation initiated by:  Elmer Bales  Subjective/Objective Assessment:   Patient admitted for PLIF.     Action/Plan:   Will follow for discharge needs.   Anticipated DC Date:  04/08/2013   Anticipated DC Plan:  HOME/SELF CARE      DC Planning Services  CM consult      Choice offered to / List presented to:     DME arranged  3-N-1  Levan Hurst      DME agency  [facility]        Status of service:  Completed, signed off Medicare Important Message given?   (If response is "NO", the following Medicare IM given date fields will be blank) Date Medicare IM given:   Date Additional Medicare IM given:    Discharge Disposition:  HOME/SELF CARE  Per UR Regulation:  Reviewed for med. necessity/level of care/duration of stay  If discussed at Long Length of Stay Meetings, dates discussed:    Comments:  04/08/13 1030 Courtney Clearence Cheek RN, MSN, CM- Spoke with patient's RN, who states that patient will need DME for discharge.  CM notified [facility] of needs for discharge home today.

## 2013-04-08 NOTE — Progress Notes (Signed)
UR complete.  Worthington Cruzan RN, MSN 

## 2013-04-08 NOTE — Progress Notes (Signed)
Physical Therapy Treatment Patient Details Name: Christopher Gilbert MRN: [id number] DOB: 17-Aug-1985 Today's Date: 04/08/2013 Time: 7829-5621 PT Time Calculation (min): 14 min  PT Assessment / Plan / Recommendation  History of Present Illness Pt admitted for L5-S1 fusion    PT Comments   Pt notes continuing to have increased pain in LEs in morning.  Pt states he will have A from fiance and mother at D/C.  Pt will need a RW at D/C.    Follow Up Recommendations  No PT follow up;Supervision/Assistance - 24 hour     Does the patient have the potential to tolerate intense rehabilitation     Barriers to Discharge        Equipment Recommendations  Rolling walker with 5" wheels    Recommendations for Other Services    Frequency Min 5X/week   Progress towards PT Goals Progress towards PT goals: Progressing toward goals  Plan Current plan remains appropriate    Precautions / Restrictions Precautions Precautions: Back Precaution Comments: Pt independently recalled 3/3 precautions. Required Braces or Orthoses: Spinal Brace Spinal Brace: Lumbar corset;Applied in sitting position Restrictions Weight Bearing Restrictions: No   Pertinent Vitals/Pain 5/10 with mobility in LEs.      Mobility  Bed Mobility Bed Mobility: Sit to Sidelying Left Sit to Sidelying Left: 5: Supervision;HOB flat Transfers Transfers: Sit to Stand;Stand to Sit Sit to Stand: 5: Supervision;With upper extremity assist;From bed Stand to Sit: 5: Supervision;With upper extremity assist;To bed Details for Transfer Assistance: Supervision for safety. No physical assist needed. Ambulation/Gait Ambulation/Gait Assistance: 5: Supervision Ambulation Distance (Feet): 200 Feet Assistive device: Rolling walker Ambulation/Gait Assistance Details: pt leans heavily on RW throughout ambulation.  pt states this is 2/2 pain in LEs.   Gait Pattern: Step-through pattern;Decreased stride length;Trunk flexed;Narrow base of  support Stairs: Yes Stairs Assistance: 4: Min guard Stair Management Technique: One rail Left;Forwards;Step to pattern Number of Stairs: 11 Wheelchair Mobility Wheelchair Mobility: No    Exercises     PT Diagnosis:    PT Problem List:   PT Treatment Interventions:     PT Goals (current goals can now be found in the care plan section) Acute Rehab PT Goals Patient Stated Goal: eventually back to work Time For Goal Achievement: 04/13/13 Potential to Achieve Goals: Good  Visit Information  Last PT Received On: 04/08/13 Assistance Needed: +1 History of Present Illness: Pt admitted for L5-S1 fusion     Subjective Data  Patient Stated Goal: eventually back to work   Cognition  Cognition Arousal/Alertness: Awake/alert Behavior During Therapy: WFL for tasks assessed/performed Overall Cognitive Status: Within Functional Limits for tasks assessed    Balance  Balance Balance Assessed: No  End of Session PT - End of Session Equipment Utilized During Treatment: Gait belt;Back brace Activity Tolerance: Patient limited by pain Patient left: in bed;with call bell/phone within reach Nurse Communication: Mobility status   GP     RitenourAlison Murray, Nunda [phone] 04/08/2013, 9:13 AM

## 2013-11-22 ENCOUNTER — Inpatient Hospital Stay (HOSPITAL_COMMUNITY)
Admission: EM | Admit: 2013-11-22 | Discharge: 2013-11-24 | DRG: 101 | Disposition: A | Discharge status: Home / Self Care | Payer: Medicaid Other | Attending: Internal Medicine | Admitting: Internal Medicine

## 2013-11-22 ENCOUNTER — Emergency Department (HOSPITAL_COMMUNITY): Payer: Medicaid Other

## 2013-11-22 ENCOUNTER — Encounter (HOSPITAL_COMMUNITY): Payer: Self-pay | Admitting: Emergency Medicine

## 2013-11-22 DIAGNOSIS — G43909 Migraine, unspecified, not intractable, without status migrainosus: Secondary | ICD-10-CM | POA: Diagnosis present

## 2013-11-22 DIAGNOSIS — E872 Acidosis, unspecified: Secondary | ICD-10-CM | POA: Diagnosis present

## 2013-11-22 DIAGNOSIS — G40401 Other generalized epilepsy and epileptic syndromes, not intractable, with status epilepticus: Principal | ICD-10-CM | POA: Diagnosis present

## 2013-11-22 DIAGNOSIS — G40909 Epilepsy, unspecified, not intractable, without status epilepticus: Secondary | ICD-10-CM

## 2013-11-22 DIAGNOSIS — M10071 Idiopathic gout, right ankle and foot: Secondary | ICD-10-CM

## 2013-11-22 DIAGNOSIS — M549 Dorsalgia, unspecified: Secondary | ICD-10-CM | POA: Diagnosis present

## 2013-11-22 DIAGNOSIS — E876 Hypokalemia: Secondary | ICD-10-CM | POA: Diagnosis present

## 2013-11-22 DIAGNOSIS — Z9119 Patient's noncompliance with other medical treatment and regimen: Secondary | ICD-10-CM

## 2013-11-22 DIAGNOSIS — M109 Gout, unspecified: Secondary | ICD-10-CM | POA: Diagnosis present

## 2013-11-22 DIAGNOSIS — F17200 Nicotine dependence, unspecified, uncomplicated: Secondary | ICD-10-CM | POA: Diagnosis present

## 2013-11-22 DIAGNOSIS — G40901 Epilepsy, unspecified, not intractable, with status epilepticus: Secondary | ICD-10-CM | POA: Diagnosis present

## 2013-11-22 DIAGNOSIS — F909 Attention-deficit hyperactivity disorder, unspecified type: Secondary | ICD-10-CM | POA: Diagnosis present

## 2013-11-22 DIAGNOSIS — M129 Arthropathy, unspecified: Secondary | ICD-10-CM | POA: Diagnosis present

## 2013-11-22 DIAGNOSIS — K219 Gastro-esophageal reflux disease without esophagitis: Secondary | ICD-10-CM | POA: Diagnosis present

## 2013-11-22 DIAGNOSIS — M4316 Spondylolisthesis, lumbar region: Secondary | ICD-10-CM

## 2013-11-22 DIAGNOSIS — Z91199 Patient's noncompliance with other medical treatment and regimen due to unspecified reason: Secondary | ICD-10-CM

## 2013-11-22 LAB — CBG MONITORING, ED: GLUCOSE-CAPILLARY: 87 mg/dL (ref 70–99)

## 2013-11-22 LAB — VALPROIC ACID LEVEL: Valproic Acid Lvl: <10 ug/mL — ABNORMAL LOW (ref 50.0–100.0)

## 2013-11-22 MED ORDER — VALPROATE SODIUM 500 MG/5ML IV SOLN
1000.0000 mg | Freq: Once | INTRAVENOUS | Status: AC
  Administered 2013-11-22: 1000 mg via INTRAVENOUS
  Filled 2013-11-22: qty 10

## 2013-11-22 MED ORDER — OXYCODONE-ACETAMINOPHEN 5-325 MG PO TABS
1.0000 | ORAL_TABLET | Freq: Once | ORAL | Status: AC
  Administered 2013-11-22: 1 via ORAL
  Filled 2013-11-22: qty 1

## 2013-11-22 NOTE — ED Notes (Signed)
Per EMS, pt was at work when he had a witnessed seizure. Pt fell and hit his head and neck. Pt reports head and neck pain. Pt refused C-collar with EMS, pt placed in c-collar upon arrival. Per witness seizure lasted approximately 8 minutes and had full body movement. Pt reports hx of seizures that he takes Depakote and Topamax. Pt also reports taking two percocets for pain for gout.  BP: 126/80 Hr: 112 SpO2: 99% 2L  R: 18

## 2013-11-22 NOTE — ED Provider Notes (Addendum)
CSN: [id number]     Arrival date & time 11/22/13  2025 History   None    Chief Complaint  Patient presents with  . Seizures     (Consider location/radiation/quality/duration/timing/severity/associated sxs/prior Treatment) HPI Patient reports a grand mal seizure today. He admits to noncompliance with Depakote a few weeks ago. He complains of neck pain and pain in his tailbone after falling as result a seizure.  complaint. He also treated himself with a Percocet earlier today for pain in his right great toe secondary gallop Past Medical History  Diagnosis Date  . Attention deficit hyperactivity disorder   . GERD (gastroesophageal reflux disease)   . Headache(784.0)     migraines after seizure  . Seizures     grand mal, haven't had a seizure in "a long time"  . Epilepsy   . Arthritis     gout in right big toe   Past Surgical History  Procedure Laterality Date  . Colonoscopy    . Back surgery     No family history on file. History  Substance Use Topics  . Smoking status: Current Every Day Smoker    Types: Cigarettes  . Smokeless tobacco: Never Used  . Alcohol Use: Yes     Comment: occasional/socially    Review of Systems  Constitutional: Negative.   HENT: Negative.   Respiratory: Negative.   Cardiovascular: Negative.   Gastrointestinal: Negative.   Musculoskeletal: Positive for neck pain.       Pain at tailbone  Skin: Negative.   Neurological: Positive for seizures.  Psychiatric/Behavioral: Negative.   All other systems reviewed and are negative.     Allergies  Review of patient's allergies indicates no known allergies.  Home Medications   Prior to Admission medications   Medication Sig Start Date End Date Taking? Authorizing Provider  amphetamine-dextroamphetamine (ADDERALL) 10 MG tablet Take 1 tablet by mouth daily. 01/17/13   Historical Provider, MD  diazepam (VALIUM) 5 MG tablet Take 1 tablet (5 mg total) by mouth every 6 (six) hours as needed for muscle  spasms. 04/08/13   Tia Alert, MD  divalproex (DEPAKOTE ER) 250 MG 24 hr tablet Take 250 mg by mouth 2 (two) times daily.     Historical Provider, MD  divalproex (DEPAKOTE ER) 500 MG 24 hr tablet Take 1 tablet by mouth 2 (two) times daily.  02/18/13   Historical Provider, MD  famotidine (PEPCID) 20 MG tablet Take 20 mg by mouth 2 (two) times daily as needed. 03/25/13   Sunnie Nielsen, MD  ondansetron (ZOFRAN) 4 MG tablet Take 4 mg by mouth every 8 (eight) hours as needed for nausea or vomiting.    Historical Provider, MD  oxyCODONE-acetaminophen (PERCOCET/ROXICET) 5-325 MG per tablet Take 1-2 tablets by mouth every 6 (six) hours as needed for severe pain. 04/08/13   Tia Alert, MD  pantoprazole (PROTONIX) 20 MG tablet Take 20 mg by mouth daily as needed. 03/25/13   Sunnie Nielsen, MD  predniSONE (DELTASONE) 20 MG tablet 3 tabs po daily x 3 days, then 2 tabs x 3 days, then 1.5 tabs x 3 days, then 1 tab x 3 days, then 0.5 tabs x 3 days 03/26/13   Gilda Crease, MD  topiramate (TOPAMAX) 100 MG tablet Take 1 tablet by mouth 2 (two) times daily. 02/18/13   Historical Provider, MD   BP 129/62  Pulse 107  Temp(Src) 98.2 F (36.8 C) (Oral)  Resp 18  Ht 6' (1.829 m)  Wt 225 lb (  102.059 kg)  BMI 30.51 kg/m2  SpO2 94% Physical Exam  Nursing note and vitals reviewed. Constitutional: He is oriented to person, place, and time. He appears well-developed and well-nourished.  HENT:  Head: Normocephalic and atraumatic.  Eyes: Conjunctivae are normal. Pupils are equal, round, and reactive to light.  Neck: Neck supple. No tracheal deviation present. No thyromegaly present.  Cardiovascular: Normal rate and regular rhythm.   No murmur heard. Pulmonary/Chest: Effort normal and breath sounds normal.  Abdominal: Soft. Bowel sounds are normal. He exhibits no distension. There is no tenderness.  Musculoskeletal: Normal range of motion. He exhibits no edema and no tenderness.  Tender at cervical spine.  No tenderness at lumbar or thoracic spine. Pelvis stable nontender. All 4 extremities a deformity or point tenderness or vascular intact right great toe is not red and  warm  Neurological: He is alert and oriented to person, place, and time. No cranial nerve deficit. Coordination normal.  Skin: Skin is warm and dry. No rash noted.  Psychiatric: He has a normal mood and affect.    ED Course  Procedures (including critical care time) Labs Review Labs Reviewed - No data to display  Imaging Review No results found.   EKG Interpretation None      Results for orders placed during the hospital encounter of 11/22/13  VALPROIC ACID LEVEL      Result Value Ref Range   Valproic Acid Lvl <10.0 (*) 50.0 - 100.0 ug/mL  CBG MONITORING, ED      Result Value Ref Range   Glucose-Capillary 87  70 - 99 mg/dL   Comment 1 Notify RN     Comment 2 Documented in Chart     Ct Cervical Spine Wo Contrast  11/22/2013   CLINICAL DATA:  Seizure, fall.  Neck pain.  EXAM: CT CERVICAL SPINE WITHOUT CONTRAST  TECHNIQUE: Multidetector CT imaging of the cervical spine was performed without intravenous contrast. Multiplanar CT image reconstructions were also generated.  COMPARISON:  None.  FINDINGS: Loss of normal cervical lordosis. Prevertebral soft tissues are normal. No fracture. No subluxation. No epidural or paraspinal hematoma.  IMPRESSION: No acute bony abnormality.  Cervical straightening which may be positional or related to muscle spasm.   Electronically Signed   By: Charlett NoseKevin  Dover M.D.   On: 11/22/2013 23:33    11:10 PM pain is improved after treatment with Percocet. He is alert Glasgow Coma Score 15 ambulate without difficulty after treatment with intravenous depakon load MDM  plan prescription Percocet. He can also take ibuprofen for his gout. He is instructed to take Depakote as directed.referral to resource guide as pt tells me he's called [facility] , and they're not taking new pts presently. His  prior PCP won't see him due to insurance Final diagnoses:  None   #1 seizure disorder #2 medication noncompliance #3 cervical strain #4 pain at coccyx #5 pain in right great toe     Doug SouSam Jakai Onofre, MD 11/23/13 [id number]  Doug SouSam Ziara Thelander, MD 11/23/13 [id number]

## 2013-11-22 NOTE — ED Notes (Signed)
Patient transported to CT 

## 2013-11-23 DIAGNOSIS — M109 Gout, unspecified: Secondary | ICD-10-CM | POA: Diagnosis present

## 2013-11-23 DIAGNOSIS — K219 Gastro-esophageal reflux disease without esophagitis: Secondary | ICD-10-CM | POA: Diagnosis present

## 2013-11-23 DIAGNOSIS — G40401 Other generalized epilepsy and epileptic syndromes, not intractable, with status epilepticus: Principal | ICD-10-CM

## 2013-11-23 DIAGNOSIS — E872 Acidosis, unspecified: Secondary | ICD-10-CM | POA: Diagnosis present

## 2013-11-23 DIAGNOSIS — G43909 Migraine, unspecified, not intractable, without status migrainosus: Secondary | ICD-10-CM | POA: Diagnosis present

## 2013-11-23 DIAGNOSIS — E876 Hypokalemia: Secondary | ICD-10-CM | POA: Diagnosis present

## 2013-11-23 DIAGNOSIS — R569 Unspecified convulsions: Secondary | ICD-10-CM | POA: Diagnosis not present

## 2013-11-23 DIAGNOSIS — G40901 Epilepsy, unspecified, not intractable, with status epilepticus: Secondary | ICD-10-CM | POA: Diagnosis present

## 2013-11-23 DIAGNOSIS — Z91199 Patient's noncompliance with other medical treatment and regimen due to unspecified reason: Secondary | ICD-10-CM | POA: Diagnosis not present

## 2013-11-23 DIAGNOSIS — G40909 Epilepsy, unspecified, not intractable, without status epilepticus: Secondary | ICD-10-CM

## 2013-11-23 DIAGNOSIS — M129 Arthropathy, unspecified: Secondary | ICD-10-CM | POA: Diagnosis present

## 2013-11-23 DIAGNOSIS — M549 Dorsalgia, unspecified: Secondary | ICD-10-CM | POA: Diagnosis present

## 2013-11-23 DIAGNOSIS — F909 Attention-deficit hyperactivity disorder, unspecified type: Secondary | ICD-10-CM | POA: Diagnosis present

## 2013-11-23 DIAGNOSIS — F17200 Nicotine dependence, unspecified, uncomplicated: Secondary | ICD-10-CM | POA: Diagnosis present

## 2013-11-23 LAB — BASIC METABOLIC PANEL
Anion gap: 13 (ref 5–15)
BUN: 8 mg/dL (ref 6–23)
CALCIUM: 8.4 mg/dL (ref 8.4–10.5)
CO2: 24 mEq/L (ref 19–32)
CREATININE: 0.75 mg/dL (ref 0.50–1.35)
Chloride: 105 mEq/L (ref 96–112)
GFR calc non Af Amer: >90 mL/min (ref >90)
Glucose, Bld: 83 mg/dL (ref 70–99)
Potassium: 3.5 mEq/L — ABNORMAL LOW (ref 3.7–5.3)
Sodium: 142 mEq/L (ref 137–147)

## 2013-11-23 LAB — CBC
HCT: 41.6 % (ref 39.0–52.0)
Hemoglobin: 14.3 g/dL (ref 13.0–17.0)
MCH: 29.3 pg (ref 26.0–34.0)
MCHC: 34.4 g/dL (ref 30.0–36.0)
MCV: 85.2 fL (ref 78.0–100.0)
Platelets: 230 10*3/uL (ref 150–400)
RBC: 4.88 MIL/uL (ref 4.22–5.81)
RDW: 13 % (ref 11.5–15.5)
WBC: 10 10*3/uL (ref 4.0–10.5)

## 2013-11-23 LAB — LACTIC ACID, PLASMA
LACTIC ACID, VENOUS: 12.5 mmol/L — AB (ref 0.5–2.2)
LACTIC ACID, VENOUS: 0.8 mmol/L (ref 0.5–2.2)

## 2013-11-23 LAB — URIC ACID: URIC ACID, SERUM: 15.3 mg/dL — AB (ref 4.0–7.8)

## 2013-11-23 LAB — CK: CK TOTAL: 443 U/L — AB (ref 7–232)

## 2013-11-23 MED ORDER — OXYCODONE-ACETAMINOPHEN 5-325 MG PO TABS: 1.0000 | ORAL_TABLET | Freq: Four times a day (QID) | ORAL | Status: DC | PRN

## 2013-11-23 MED ORDER — SODIUM CHLORIDE 0.9 % IV SOLN
INTRAVENOUS | Status: DC
  Administered 2013-11-23: 15:00:00 via INTRAVENOUS

## 2013-11-23 MED ORDER — DIAZEPAM 5 MG/ML IJ SOLN
INTRAMUSCULAR | Status: AC
  Administered 2013-11-23: 5 mg via INTRAVENOUS
  Filled 2013-11-23: qty 2

## 2013-11-23 MED ORDER — ACETAMINOPHEN 650 MG RE SUPP: 650.0000 mg | Freq: Four times a day (QID) | RECTAL | Status: DC | PRN

## 2013-11-23 MED ORDER — ONDANSETRON HCL 4 MG PO TABS: 4.0000 mg | ORAL_TABLET | Freq: Four times a day (QID) | ORAL | Status: DC | PRN

## 2013-11-23 MED ORDER — ACETAMINOPHEN 325 MG PO TABS
650.0000 mg | ORAL_TABLET | Freq: Four times a day (QID) | ORAL | Status: DC | PRN
  Administered 2013-11-23: 650 mg via ORAL
  Filled 2013-11-23: qty 2

## 2013-11-23 MED ORDER — POTASSIUM CHLORIDE CRYS ER 20 MEQ PO TBCR
40.0000 meq | EXTENDED_RELEASE_TABLET | Freq: Once | ORAL | Status: AC
  Administered 2013-11-23: 40 meq via ORAL
  Filled 2013-11-23: qty 2

## 2013-11-23 MED ORDER — LORAZEPAM 2 MG/ML IJ SOLN
INTRAMUSCULAR | Status: AC
  Filled 2013-11-23: qty 3

## 2013-11-23 MED ORDER — LEVETIRACETAM IN NACL 1000 MG/100ML IV SOLN
1000.0000 mg | Freq: Two times a day (BID) | INTRAVENOUS | Status: DC
  Administered 2013-11-23: 1000 mg via INTRAVENOUS
  Filled 2013-11-23 (×3): qty 100

## 2013-11-23 MED ORDER — INDOMETHACIN 50 MG PO CAPS
50.0000 mg | ORAL_CAPSULE | Freq: Three times a day (TID) | ORAL | Status: DC
  Administered 2013-11-23 – 2013-11-24 (×2): 50 mg via ORAL
  Filled 2013-11-23 (×5): qty 1

## 2013-11-23 MED ORDER — LIDOCAINE HCL (CARDIAC) 20 MG/ML IV SOLN
INTRAVENOUS | Status: AC
  Filled 2013-11-23: qty 5

## 2013-11-23 MED ORDER — HYDROMORPHONE HCL PF 1 MG/ML IJ SOLN
0.5000 mg | INTRAMUSCULAR | Status: DC | PRN
  Administered 2013-11-23: 1 mg via INTRAVENOUS
  Filled 2013-11-23: qty 1

## 2013-11-23 MED ORDER — ETOMIDATE 2 MG/ML IV SOLN
INTRAVENOUS | Status: AC
  Filled 2013-11-23: qty 20

## 2013-11-23 MED ORDER — ROCURONIUM BROMIDE 50 MG/5ML IV SOLN
INTRAVENOUS | Status: AC
  Filled 2013-11-23: qty 2

## 2013-11-23 MED ORDER — LORAZEPAM 2 MG/ML IJ SOLN
INTRAMUSCULAR | Status: AC
  Administered 2013-11-23: 2 mg
  Filled 2013-11-23: qty 1

## 2013-11-23 MED ORDER — SUCCINYLCHOLINE CHLORIDE 20 MG/ML IJ SOLN
INTRAMUSCULAR | Status: AC
  Filled 2013-11-23: qty 1

## 2013-11-23 MED ORDER — LEVETIRACETAM IN NACL 1000 MG/100ML IV SOLN
1000.0000 mg | Freq: Once | INTRAVENOUS | Status: AC
  Administered 2013-11-23: 1000 mg via INTRAVENOUS
  Filled 2013-11-23: qty 100

## 2013-11-23 MED ORDER — ONDANSETRON HCL 4 MG/2ML IJ SOLN
4.0000 mg | Freq: Four times a day (QID) | INTRAMUSCULAR | Status: DC | PRN
Start: 2013-11-23 — End: 2013-11-24

## 2013-11-23 NOTE — Progress Notes (Signed)
Utilization Review Completed.Christopher Gilbert T8/15/2015  

## 2013-11-23 NOTE — ED Provider Notes (Signed)
Called to see patient STAT for active seizure. Arrived to find patient agitated, confused, moving all 4 extremities in a choreaform  Parttern. Unresponsive to verbal stimuli but, eyes open, gag intact. Patient had been discharged after eval for seizure activity. He reported that he was non-compliant with depakote and Topomax. He was loaded with Depacon. Patient had been treated with Ativan 2mg  IV just prior to my arrival to his room. No response to this. Treated with Valium 5mg  IV and this broke his seizure. Now with sonorous respirations at 24 to 28/hr, difficult to arouse but, gag reflex intact. Keppra load initiated. Case discussed with Dr. Vonita MossSteward who agrees with this plan along with plan to admit for observation to medicine.   Brandt LoosenJulie Manly, MD 11/23/13 [phone]

## 2013-11-23 NOTE — ED Notes (Addendum)
Monique RN in to discharge pt home, Gabriel RungMonique called out for help, Arther DamesLeslie RN, Lysle MoralesMathew RN, and Ian MalkinZach RN went into room to find pt in a full body seizure. Pt assisted on his side by Lysle MoralesMathew RN and Verlon AuLeslie RN, suction was set up, pt's lips became cyanotic, pt's face then turned blue, NRB mask applied, pt was suctioned.

## 2013-11-23 NOTE — ED Notes (Signed)
Admitting MD at bedside.

## 2013-11-23 NOTE — Progress Notes (Signed)
Patient arrived to floor from ED in post-ictal state with [facility]  Patient is VSS except for increased respiratory rate d/t to lactic acidosis. Patient is unable to answer questions related to orientation, only to state that his name is Christopher Gilbert. Patient falls asleep while talking. Patient's bed is lowest position, Patient's bed rails are padded with pillows, patient was placed on 2L/NS. Patient is placed on IV fluids. Suction is set-up. Unable to complete AHD.  SCDs ordered. Will continue to monitor accordingly.

## 2013-11-23 NOTE — Progress Notes (Addendum)
   [facility]                                                                              Patient Demographics  Christopher Gilbert, is a 28 y.o. male, DOB - 10/18/1985, ZOX:[id number]  Admit date - 11/22/2013   Admitting Physician Ron ParkerHarvette C Jenkins, MD  Outpatient Primary MD for the patient is Irving CopasHACKER,ROBERT KELLER, MD  LOS - 1   Chief Complaint  Patient presents with  . Seizures      HPI on 11/23/2013 Christopher Gilbert is a 28 y.o. male with a history of Epilepsy and Gout who suffered a witnessed grand mal seizure while he was at work, lasting close to 18 minutes. He was medicated in the ED and had improved and was about to be discharged to home when he suffered another grand mal seizure and was retained for admission after administration of IV Valium, and then loaded with IV Keppra. Neurology was consulted by the EDP.   Assessment & Plan   Patient admitted early this morning by Dr. Della GooHarvette Jenkins. Please see her history and physical. Agree with current assessment and plan.  Status Epilepticus -Patient was loaded with IV Keppra loaded with IV Depakote -Will continue seizure precautions and neuro checks -Neurology consulted and appreciated -Neurology also recommended topamax BID   Lactic Acidosis -Likely due to seizures -Continue IVF -Initial lactic acid 12.5, rechecked 0.8  Gout -Uric acid level: 15.3 -Will start indocin  Hypokalemia -Will replace and continue to monitor BMP  Code Status: Full  Family Communication: None at bedside  Disposition Plan: Admitted  Time Spent in minutes   30 minutes  Procedures  None  Consults   Neurology  DVT Prophylaxis  SCDs   Abundio Teuscher D.O. on 11/23/2013 at 1:03 PM  Between 7am to 7pm - Pager - [phone]  After 7pm go to [email or web address] - password [email or web address]  And look for the night coverage person covering for me after hours  [facility] Office  [phone]

## 2013-11-23 NOTE — H&P (Addendum)
[facility] Admission History and Physical       Christopher AltMatthew Vincent Rome ZOX:[id number] DOB: 12/08/1985 DOA: 11/22/2013  Referring physician: EDP PCP: Irving CopasHACKER,ROBERT KELLER, MD  Specialists:   Chief Complaint: Seizures  HPI: Christopher Gilbert is a 28 y.o. male with a history of Epilepsy and Gout who suffered a witnessed grand mal seizure while he was at work lasting close to  18 minutes.   He was medicated in the ED and had improved and was about to be discharged to home when he suffered another grand mal seizure and was retained for admission after administration of IV Valium, and then loaded with IV Keppra.  Neurology was consulted  By the EDP.     Review of Systems: Unable to Obtain from the Patient  Past Medical History  Diagnosis Date  . Attention deficit hyperactivity disorder   . GERD (gastroesophageal reflux disease)   . Headache(784.0)     migraines after seizure  . Seizures     grand mal, haven't had a seizure in "a long time"  . Epilepsy   . Arthritis     gout in right big toe     Past Surgical History  Procedure Laterality Date  . Colonoscopy    . Back surgery        Prior to Admission medications   Medication Sig Start Date End Date Taking? Authorizing Provider  divalproex (DEPAKOTE ER) 250 MG 24 hr tablet Take 250 mg by mouth 2 (two) times daily. Takes with the 500mg  to make total of 750mg  per family member   Yes Historical Provider, MD  divalproex (DEPAKOTE ER) 500 MG 24 hr tablet Take 1 tablet by mouth 2 (two) times daily. Take with the 250mg  to make total of 750mg  per family member 02/18/13  Yes Historical Provider, MD  topiramate (TOPAMAX) 100 MG tablet Take 1 tablet by mouth 2 (two) times daily. 02/18/13  Yes Historical Provider, MD  oxyCODONE-acetaminophen (PERCOCET) 5-325 MG per tablet Take 1-2 tablets by mouth every 6 (six) hours as needed. 11/23/13   Doug SouSam Jacubowitz, MD     No Known Allergies   Social History:  reports that he has been  smoking Cigarettes.  He has been smoking about 0.00 packs per day. He has never used smokeless tobacco. He reports that he drinks alcohol. He reports that he does not use illicit drugs.     No family history on file.     Physical Exam:  GEN:  Obtunded Obese  28 y.o. Caucasian male examined and in no acute distress; cooperative with exam Filed Vitals:   11/23/13 0135 11/23/13 0138 11/23/13 0230 11/23/13 0300  BP: 137/75  103/39 117/51  Pulse:  75 103   Temp:      TempSrc:      Resp:  20 24 22   Height:      Weight:      SpO2:  98% 98%    Blood pressure 117/51, pulse 103, temperature 98.2 F (36.8 C), temperature source Oral, resp. rate 22, height 6' (1.829 m), weight 102.059 kg (225 lb), SpO2 98.00%. PSYCH: He is alert and oriented x1  HEENT: Normocephalic and Atraumatic, Mucous membranes pink; PERRLA; EOM intact; Fundi:  Benign;  No scleral icterus, Nares: Patent, Oropharynx: Clear;      Neck:  FROM, No Cervical Lymphadenopathy nor Thyromegaly or Carotid Bruit; No JVD; Breasts:: Not examined CHEST WALL: No tenderness CHEST: Normal respiration, clear to auscultation bilaterally HEART: Regular rate and rhythm; no murmurs rubs or  gallops BACK: No kyphosis or scoliosis; No CVA tenderness ABDOMEN: Positive Bowel Sounds, Obese, Soft Non-Tender; No Masses, No Organomegaly   Rectal Exam: Not done EXTREMITIES: No Cyanosis, Clubbing, or Edema; No Ulcerations. Genitalia: not examined PULSES: 2+ and symmetric SKIN: Normal hydration no rash or ulceration CNS: A x O x 1, Obtunded, Able to Move all 4 Extremities Vascular: pulses palpable throughout    Labs on Admission:  Basic Metabolic Panel: No results found for this basename: NA, K, CL, CO2, GLUCOSE, BUN, CREATININE, CALCIUM, MG, PHOS,  in the last 168 hours Liver Function Tests: No results found for this basename: AST, ALT, ALKPHOS, BILITOT, PROT, ALBUMIN,  in the last 168 hours No results found for this basename: LIPASE, AMYLASE,   in the last 168 hours No results found for this basename: AMMONIA,  in the last 168 hours CBC: No results found for this basename: WBC, NEUTROABS, HGB, HCT, MCV, PLT,  in the last 168 hours Cardiac Enzymes:  Recent Labs Lab 11/23/13 0154  CKTOTAL 443*    BNP (last 3 results) No results found for this basename: PROBNP,  in the last 8760 hours CBG:  Recent Labs Lab 11/22/13 2253  GLUCAP 87    Radiological Exams on Admission: Ct Cervical Spine Wo Contrast  11/22/2013   CLINICAL DATA:  Seizure, fall.  Neck pain.  EXAM: CT CERVICAL SPINE WITHOUT CONTRAST  TECHNIQUE: Multidetector CT imaging of the cervical spine was performed without intravenous contrast. Multiplanar CT image reconstructions were also generated.  COMPARISON:  None.  FINDINGS: Loss of normal cervical lordosis. Prevertebral soft tissues are normal. No fracture. No subluxation. No epidural or paraspinal hematoma.  IMPRESSION: No acute bony abnormality.  Cervical straightening which may be positional or related to muscle spasm.   Electronically Signed   By: Charlett Nose M.D.   On: 11/22/2013 23:33     Assessment/Plan:   28 y.o. male with  Principal Problem:   1.   Status epilepticus   IV Keppra  Loaded and    Given IV Depakote   Seizure Precautions   Neuro Checks   Neuro Consulted  Active Problems:  2.   Lactic acidosis- due to #1   IVFs   Monitor Lactic Acid levels   3.   Gout   Send Uric Acid Level   4.    DVT Prophylaxis    SCDs   Code Status:   FULL CODE Family Communication:    No Family Present Disposition Plan:      Inpatient  Time spent:   60 Minutes  Ron Parker [facility] Pager [phone]   If 7AM -7PM Please Contact the Day Rounding Team MD for [facility]  If 7PM-7AM, Please Contact night-coverage  [email or web address] Password [email or web address] 11/23/2013, 3:54 AM

## 2013-11-23 NOTE — ED Notes (Signed)
Pt ambulated in hallway with Md. Pt gait steady.

## 2013-11-23 NOTE — ED Notes (Addendum)
Called report to Joy on 4N; RN has concerns about Lactic acid and CK; will speak with Charge RN to confirm placement on floor; Lovell SheehanJenkins Md confirmed ok placement; will await for call back from floor. Md notified of lactic and CK results.

## 2013-11-23 NOTE — ED Notes (Signed)
MD at bedside. 

## 2013-11-23 NOTE — Discharge Instructions (Signed)
Epilepsy Take your Depakote as instructed or you will have more seizures. Ibuprofen is good medicine for gout. you can take Percocet prescribed as well. Call any of the numbers on the resource guide to get a new primary care physician People with epilepsy have times when they shake and jerk uncontrollably (seizures). This happens when there is a sudden change in brain function. Epilepsy may have many possible causes. Anything that disturbs the normal pattern of brain cell activity can lead to seizures. HOME CARE   Follow your doctor's instructions about driving and safety during normal activities.  Get enough sleep.  Only take medicine as told by your doctor.  Avoid things that you know can cause you to have seizures (triggers).  Write down when your seizures happen and what you remember about each seizure. Write down anything you think may have caused the seizure to happen.  Tell the people you live and work with that you have seizures. Make sure they know how to help you. They should:  Cushion your head and body.  Turn you on your side.  Not restrain you.  Not place anything inside your mouth.  Call for local emergency medical help if there is any question about what has happened.  Keep all follow-up visits with your doctor. This is very important. GET HELP IF:  You get an infection or start to feel sick. You may have more seizures when you are sick.  You are having seizures more often.  Your seizure pattern is changing. GET HELP RIGHT AWAY IF:   A seizure does not stop after a few seconds or minutes.  A seizure causes you to have trouble breathing.  A seizure gives you a very bad headache.  A seizure makes you unable to speak or use a part of your body. Document Released: 01/23/2009 Document Revised: 01/16/2013 Document Reviewed: 11/07/2012 [facility] Patient Information 2015 [place], [place]. This information is not intended to replace advice given to you by your health  care provider. Make sure you discuss any questions you have with your health care provider.  Emergency Department Resource Guide 1) Find a Doctor and Pay Out of Pocket Although you won't have to find out who is covered by your insurance plan, it is a good idea to ask around and get recommendations. You will then need to call the office and see if the doctor you have chosen will accept you as a new patient and what types of options they offer for patients who are self-pay. Some doctors offer discounts or will set up payment plans for their patients who do not have insurance, but you will need to ask so you aren't surprised when you get to your appointment.  2) Contact Your Local Health Department Not all health departments have doctors that can see patients for sick visits, but many do, so it is worth a call to see if yours does. If you don't know where your local health department is, you can check in your phone book. The CDC also has a tool to help you locate your state's health department, and many state websites also have listings of all of their local health departments.  3) Find a Walk-in Clinic If your illness is not likely to be very severe or complicated, you may want to try a walk in clinic. These are popping up all over the country in pharmacies, drugstores, and shopping centers. They're usually staffed by nurse practitioners or physician assistants that have been trained to treat common  illnesses and complaints. They're usually fairly quick and inexpensive. However, if you have serious medical issues or chronic medical problems, these are probably not your best option.  No Primary Care Doctor: - Call Health Connect at  [phone] - they can help you locate a primary care doctor that  accepts your insurance, provides certain services, etc. - Physician Referral Service- [phone]  Chronic Pain Problems: Organization         Address  Phone   Notes  [facility]  [phone] Patients need to be referred by their primary care doctor.   Medication Assistance: Organization         Address  Phone   Notes  [facility] [address] [place], [place] [address] [phone] --Must be a resident of [facility] -- Must have NO insurance coverage whatsoever (no Medicaid/ Medicare, etc.) -- The pt. MUST have a primary care doctor that directs their care regularly and follows them in the community   Med[facility]ist  [phone]   [employer]  [phone]    Agencies that provide inexpensive medical care: Organization         Address  Phone   Notes  [facility]  [phone]   [facility]    [phone]   [facility] [address] [place], [place] [address] [phone]   [facility] [address] [place] [place] [phone]   Planned Parenthood    [phone]   [facility]    [phone]   [facility] and [facility]  [address], [place] Phone:  [phone], Fax:  [phone] Hours of Operation:  9 am - 6 pm, M-F.  Also accepts Medicaid/Medicare and self-pay.  [facility]  [address], [place] Phone: [phone], Fax: [phone]. Hours of Operation:  8:30 am - 5:30 pm, M-F.  Also accepts Medicaid and self-pay.  [facility] [place] [address] [place] Phone: [phone]   [facility] [address] Natasha Bence [place], [place] [phone], Ext. [phone] Mondays & Thursdays: 7-9 AM.  First 15 patients are seen on a first come, first serve basis.    Medicaid-accepting [facility] Providers:  Organization         Address  Phone   Notes  [facility] [address],  [phone] Also accepts self-pay patients.  [facility] [address] Laurell Josephs [place], [place]   [phone]   [facility] [address], [place] [phone]   [facility] [address] [place] [phone]   Renaye Rakers [address], [place]   [phone] Only accepts [place] patients after they have their name applied to their card.   Self-Pay (no insurance) in [facility]:  Organization         Address  Phone   Notes  Sickle Cell Patients, [facility] [address] [place], [place] [phone]   [facility] [address] [place], [place] [phone]   [facility]  [address], [place] [phone]   [facility]/Dr. Osei-Bonsu  [address] [place] or [address], La[address],  [place] [phone] Phone number for both [facility] and [facility] locations is the same.  [facility] [address], [place] [phone]   [facility] [address] [place] or [address] [phone]   [facility] [address], [place] [phone], phone; [phone], fax Sees patients 1st and 3rd Saturday of every month.  Must not qualify for public or private insurance (i.e. Medicaid, Medicare, [place] Health Choice, Veterans' Benefits)  Household income should be no more than 200% of the poverty level The clinic cannot treat you if you are pregnant or think you are pregnant  Sexually transmitted diseases are not treated at the clinic.    Dental Care: Organization         Address  Phone  Notes  [facility] of [facility] [address] [place], [place] [phone] Accepts children up to age 110 who are enrolled in [place] or [place] Health Choice; pregnant women with a Medicaid card; and children who have applied for Medicaid or [place] Health Choice, but  were declined, whose parents can pay a reduced fee at time of service.  [facility] of [facility]  [address], [place] [phone] Accepts children up to age 41 who are enrolled in [place] or [place] Health Choice; pregnant women with a Medicaid card; and children who have applied for Medicaid or [place] Health Choice, but were declined, whose parents can pay a reduced fee at time of service.  [facility]  [address] [place], [place] [phone] Patients are seen by appointment only. Walk-ins are not accepted. [facility] will see patients 54 years of age and older. Monday - Tuesday (8am-5pm) Most Wednesdays (8:30-5pm) $30 per visit, cash only  [facility]  [address], [facility] [phone] Patients are seen by appointment only. Walk-ins are not accepted. [facility] will see patients 50 years of age and older. One Wednesday Evening (Monthly: Volunteer Based).  $30 per visit, cash only  [employer]  [phone] for adults; Children under age 62, call Graduate Pediatric Dentistry at [phone]. Children aged 38-14, please call [phone] to request a pediatric application.  Dental services are provided in all areas of dental care including fillings, crowns and bridges, complete and partial dentures, implants, gum treatment, root canals, and extractions. Preventive care is also provided. Treatment is provided to both adults and children. Patients are selected via a lottery and there is often a waiting list.   [facility] [address] [place]  [phone] [email or web address]   [facility] [address] [place], [place] [phone], Ext. [phone] Second and Fourth Thursday of each month, opens at 6:30 AM; Clinic ends at 9 AM.  Patients are seen on a first-come first-served basis, and a limited number are seen during each clinic.    [facility]  [address] Ether Griffins [place], [place] [phone]   Eligibility Requirements You must have lived in [place], [place], or [place] counties for at least the last three months.   You cannot be eligible for state or federal sponsored [place], including [employer], [place], or [employer].   You generally cannot be eligible for healthcare insurance through your employer.    How to apply: Eligibility screenings are held every Tuesday and  Wednesday afternoon from 1:00 pm until 4:00 pm. You do not need an appointment for the interview!  [facility] [facility] [address], [place], [place] [phone]   [facility]  [phone]   [facility]  [phone]   [facility]  [phone]    Behavioral Health Resources in the Community: Intensive Outpatient Programs Organization         Address  Phone  Notes  [facility] [address], [place], [place] [phone]   [facility] [address], [place], [place] [phone]   [facility] [address], [place], [place]  [phone]   [facility] [address],  [place], [place] [phone] or [phone]   Substance Abuse Resources Organization         Address  Phone  Notes  [facility]  [phone]   [facility]  [phone]   [facility]  [phone]   [facility]  [phone]   Residential & Outpatient Substance Abuse Program  [phone]   Psychological Services Organization         Address  Phone  Notes  [facility]  [phone]- [phone]   [facility]  [phone]- [phone]   [facility] [address], [place] [phone] or [phone]    Mobile Crisis Teams Organization         Address  Phone  Notes  [facility], Mobile Crisis Care  Unit  [phone]   [facility]  [address] [place], [place] [phone]   Doristine LocksSharon DeEsch [address] [place] [phone]    Self-Help/Support Groups Organization         Address  Phone             Notes  [facility] [place] - variety of support groups  336- [phone] Call for more information  Narcotics Anonymous (NA), [facility] [address], [place]  2 meetings at this location   Statisticianesidential Treatment Programs Organization         Address  Phone  Notes  [facility] [address],    [place]  [phone]   [facility]  [address], [place] [phone], [place], [place] [phone]   [facility] [address], [place] [phone] Admissions: 8am-3pm M-F  [facility] [address]    [place], [place] [phone]   [facility] [address], [place], [place] [phone]   [facility] [address]  [place], [place] [phone]   [facility] - Intensive Outpatient [address], [place], [place] [phone]   [facility] [address]  [place], [place] [phone] or [phone]   [facility] ([facility]) [address] [place], [place] [phone] Accepts Medicaid  [facility] [address]  [place] [phone] Substance Abuse/Addiction Treatment   [facility] Resources Organization         Address  Phone  Notes  [facility]  [phone]   Angie FavaJulie Brannon, PhD [address], [address] [place], [place]   [phone]) [phone] or [phone]   [facility]   [address] [place], [place] [phone]   [facility] [address], [place], [place] [phone] Insurance/Medicaid/sponsorship through [facility] [employer] [facility]  [facility]  Families [address]                                     [place], [place] [phone] Therapy/tele-psych/case  [facility] [address]   [place], [place] [phone]    Dr. Lolly Mustache  [phone]   [facility]  [employer] [facility] Dept. 1) [address] [place] 2) [address] [place] 3)  [address], [place] [phone]  [phone]   [facility] Child Abuse Hotline [phone] or [phone] (After Hours)

## 2013-11-23 NOTE — ED Notes (Signed)
Emergency Contact is Marchia MeiersFiance' Thompson's StationStephanie Monroe [phone]

## 2013-11-23 NOTE — Progress Notes (Addendum)
Patient requested to leave AMA. Patient took off telemetry box earlier with leads saying that he didn't need it anymore. Educated patient on seizure precautions and importance of cardiac monitoring. Patient needs reinforcement regarding teaching. Paged on call PA and notified Charge Nurse. Will continue to monitor accordingly.

## 2013-11-23 NOTE — ED Notes (Signed)
Pt is ok to go to floor

## 2013-11-23 NOTE — Consult Note (Signed)
Reason for Consult: Generalized seizures.  HPI:                                                                                                                                          Christopher Gilbert is an 28 y.o. male with a history of generalized seizure disorder, attention deficit disorder and gout, who was brought to the emergency room following a witnessed generalized seizure while at work. He is known seizure disorder and had not been compliant with taking anti-epilepsy medication. His been prescribed Depakote ER 750 mg twice a day and Topamax 100 mg twice a day. Valproic acid level was level was undetectable. He was given 1000 mg of valproic acid IV and was about to be discharged from the ER the head a recurrent generalized seizure. He was then given IV Valium and a loading dose of Keppra 1000 mg IV. Patient was admitted for overnight observation because of recurrent seizure activity.  Past Medical History  Diagnosis Date  . Attention deficit hyperactivity disorder   . GERD (gastroesophageal reflux disease)   . Headache(784.0)     migraines after seizure  . Seizures     grand mal, haven't had a seizure in "a long time"  . Epilepsy   . Arthritis     gout in right big toe    Past Surgical History  Procedure Laterality Date  . Colonoscopy    . Back surgery      No family history on file.  Social History:  reports that he has been smoking Cigarettes.  He has been smoking about 0.00 packs per day. He has never used smokeless tobacco. He reports that he drinks alcohol. He reports that he does not use illicit drugs.  No Known Allergies  MEDICATIONS:                                                                                                                     I have reviewed the patient's current medications.   ROS:  History obtained from  unobtainable from patient due to mental status   Blood pressure 120/67, pulse 76, temperature 98.3 F (36.8 C), temperature source Oral, resp. rate 32, height 6' (1.829 m), weight 102.059 kg (225 lb), SpO2 100.00%.   Neurologic Examination:                                                                                                      Mental Status: Somnolent but could be briefly aroused. He was oriented x3.  Speech fluent without evidence of aphasia. Able to follow commands when repeatedly alerted. Cranial Nerves: II-Visual fields were normal. III/IV/VI-Pupils were equal and reacted. Extraocular movements were full and conjugate.    VII- no facial weakness. VIII-normal. X-mild dysarthria, commensurate with the level of alertness. Motor: 5/5 bilaterally with normal tone and bulk Sensory: Normal throughout. Deep Tendon Reflexes: Trace to 1+ and symmetric. Plantars: Flexor bilaterally Cerebellar: Unable to assess due to somnolence.  No results found for this basename: cbc, bmp, coags, chol, tri, ldl, hga1c    Results for orders placed during the hospital encounter of 11/22/13 (from the past 48 hour(s))  VALPROIC ACID LEVEL     Status: Abnormal   Collection Time    11/22/13  9:28 PM      Result Value Ref Range   Valproic Acid Lvl <10.0 (*) 50.0 - 100.0 ug/mL  CBG MONITORING, ED     Status: None   Collection Time    11/22/13 10:53 PM      Result Value Ref Range   Glucose-Capillary 87  70 - 99 mg/dL   Comment 1 Notify RN     Comment 2 Documented in Chart    CK     Status: Abnormal   Collection Time    11/23/13  1:54 AM      Result Value Ref Range   Total CK 443 (*) 7 - 232 U/L  URIC ACID     Status: Abnormal   Collection Time    11/23/13  1:54 AM      Result Value Ref Range   Uric Acid, Serum 15.3 (*) 4.0 - 7.8 mg/dL  LACTIC ACID, PLASMA     Status: Abnormal   Collection Time    11/23/13  2:20 AM      Result Value Ref Range   Lactic Acid, Venous 12.5 (*) 0.5 - 2.2  mmol/L    Ct Cervical Spine Wo Contrast  11/22/2013   CLINICAL DATA:  Seizure, fall.  Neck pain.  EXAM: CT CERVICAL SPINE WITHOUT CONTRAST  TECHNIQUE: Multidetector CT imaging of the cervical spine was performed without intravenous contrast. Multiplanar CT image reconstructions were also generated.  COMPARISON:  None.  FINDINGS: Loss of normal cervical lordosis. Prevertebral soft tissues are normal. No fracture. No subluxation. No epidural or paraspinal hematoma.  IMPRESSION: No acute bony abnormality.  Cervical straightening which may be positional or related to muscle spasm.   Electronically Signed   By: Charlett Nose M.D.   On: 11/22/2013 23:33   Assessment/Plan: 28 year old male with known seizure disorder and less than  ideal compliance with taking anti-epilepsy drugs, presenting with recurrent generalized seizures.  Recommendations: 1. Continue Depakote ER 750 mg twice a day. 2. Repeat valproic acid level. 3. Resume Topamax at 100 mg twice a day. 4. Would not continue Keppra for maintenance. 5. No indication for EEG study.  We will continue to follow this patient with you.  C.R. Roseanne RenoStewart, MD [facility] [phone]  11/23/2013, 5:57 AM

## 2013-11-23 NOTE — ED Notes (Signed)
Pt's family placed in Consult Room B due to small children present. Md notified of placement of family; [place] called

## 2013-11-24 ENCOUNTER — Inpatient Hospital Stay (HOSPITAL_COMMUNITY): Payer: Medicaid Other

## 2013-11-24 LAB — BASIC METABOLIC PANEL
Anion gap: 12 (ref 5–15)
BUN: 9 mg/dL (ref 6–23)
CO2: 24 mEq/L (ref 19–32)
Calcium: 8.5 mg/dL (ref 8.4–10.5)
Chloride: 109 mEq/L (ref 96–112)
Creatinine, Ser: 0.68 mg/dL (ref 0.50–1.35)
GLUCOSE: 98 mg/dL (ref 70–99)
Potassium: 3.8 mEq/L (ref 3.7–5.3)
SODIUM: 145 meq/L (ref 137–147)

## 2013-11-24 LAB — VALPROIC ACID LEVEL

## 2013-11-24 MED ORDER — TOPIRAMATE 100 MG PO TABS
100.0000 mg | ORAL_TABLET | Freq: Two times a day (BID) | ORAL | Status: DC
Start: 2013-11-24 — End: 2013-11-24
  Administered 2013-11-24: 100 mg via ORAL
  Filled 2013-11-24: qty 1

## 2013-11-24 MED ORDER — LEVETIRACETAM 500 MG PO TABS
1000.0000 mg | ORAL_TABLET | Freq: Two times a day (BID) | ORAL | Status: DC
  Administered 2013-11-24: 1000 mg via ORAL
  Filled 2013-11-24: qty 2

## 2013-11-24 MED ORDER — LEVETIRACETAM 1000 MG PO TABS: 1000.0000 mg | ORAL_TABLET | Freq: Two times a day (BID) | ORAL | Status: DC

## 2013-11-24 MED ORDER — INDOMETHACIN 50 MG PO CAPS: 50.0000 mg | ORAL_CAPSULE | Freq: Three times a day (TID) | ORAL | Status: DC

## 2013-11-24 NOTE — Progress Notes (Signed)
Patient DC'd home.  DC instructions and prescription information given and understood by patient. Vital signs and assessments were stable.  Xray was done prior to discharge.

## 2013-11-24 NOTE — Evaluation (Signed)
Physical Therapy Evaluation Patient Details Name: Christopher Gilbert MRN: [id number] DOB: 02/06/1986 Today's Date: 11/24/2013   History of Present Illness  28 year old male with known seizure disorder and poor adherence to treatment, admitted with recurrent seizures and virtually undetectable VPA level. pt reports he  Clinical Impression  Pt adm due to above. Appears to be at baseline from mobility standpoint. No focal weakness or deficits noted. Pt c/o pain in sacrum region; denied any radiating pain. X-ray planned for today per RN. Encouraged pt to ambulate unit as tolerated. Will sign off at this time.     Follow Up Recommendations No PT follow up    Equipment Recommendations  None recommended by PT    Recommendations for Other Services       Precautions / Restrictions Precautions Precaution Comments: seizures Restrictions Weight Bearing Restrictions: No      Mobility  Bed Mobility Overal bed mobility: Independent             General bed mobility comments: encouraged log rolling to protect spine; pt noted cues but performed his way  Transfers Overall transfer level: Independent Equipment used: None             General transfer comment: no LOB or sway  Ambulation/Gait Ambulation/Gait assistance: Independent Ambulation Distance (Feet): 400 Feet Assistive device: None Gait Pattern/deviations: WFL(Within Functional Limits) Gait velocity: WFL Gait velocity interpretation: at or above normal speed for age/gender General Gait Details: pt seems to be at baseline for mobility;  Stairs Stairs: Yes Stairs assistance: Modified independent (Device/Increase time) Stair Management: One rail Right;Alternating pattern;Forwards Number of Stairs: 4 General stair comments: limited due to IV; no difficulty or LOB  Wheelchair Mobility    Modified Rankin (Stroke Patients Only)       Balance Overall balance assessment: Independent                                            Pertinent Vitals/Pain Pain Assessment: 0-10 Pain Score: 3  Pain Location: low back/sacrum region Pain Descriptors / Indicators: Aching Pain Intervention(s): Monitored during session;Other (comment) (encouraged ice or heat; pt refused)    Home Living Family/patient expects to be discharged to:: Private residence Living Arrangements: Spouse/significant other Available Help at Discharge: Family Type of Home: Apartment Home Access: Stairs to enter Entrance Stairs-Rails: Doctor, general practice of Steps: 13 Home Layout: One level Home Equipment: Other (comment);None Additional Comments: pt reports he had a spinal fusion in Decemeber of 2014    Prior Function Level of Independence: Independent         Comments: pt working as Horticulturist, commercial        Extremity/Trunk Assessment   Upper Extremity Assessment: Overall WFL for tasks assessed           Lower Extremity Assessment: Overall WFL for tasks assessed      Cervical / Trunk Assessment: Normal  Communication   Communication: No difficulties  Cognition Arousal/Alertness: Awake/alert Behavior During Therapy: WFL for tasks assessed/performed Overall Cognitive Status: Within Functional Limits for tasks assessed                      General Comments General comments (skin integrity, edema, etc.): encouraged to adhere to back precautions from previous fusion due to pain in back at this time    Exercises  Assessment/Plan    PT Assessment Patent does not need any further PT services  PT Diagnosis     PT Problem List    PT Treatment Interventions     PT Goals (Current goals can be found in the Care Plan section) Acute Rehab PT Goals Patient Stated Goal: home now PT Goal Formulation: No goals set, d/c therapy    Frequency     Barriers to discharge        Co-evaluation               End of Session Equipment Utilized During Treatment:  Gait belt Activity Tolerance: Patient tolerated treatment well Patient left: in bed;with call bell/phone within reach Nurse Communication: Mobility status         Time: [id number] PT Time Calculation (min): 15 min   Charges:   PT Evaluation $Initial PT Evaluation Tier I: 1 Procedure PT Treatments $Gait Training: 8-22 mins   PT G CodesDonell Sievert:          Jeweldean Drohan N, [place]  [phone] 11/24/2013, 11:31 AM

## 2013-11-24 NOTE — Progress Notes (Addendum)
NEURO HOSPITALIST PROGRESS NOTE   SUBJECTIVE:                                                                                                                        No further seizures noted. Complains of back pain after seizures. Wanted to leave AMA last night. On keppra.   OBJECTIVE:                                                                                                                           Vital signs in last 24 hours: Temp:  [97.8 F (36.6 C)-99 F (37.2 C)] 97.9 F (36.6 C) (08/16 0537) Pulse Rate:  [60-93] 60 (08/16 0537) Resp:  [17-21] 18 (08/16 0537) BP: (101-129)/(48-75) 123/74 mmHg (08/16 0537) SpO2:  [96 %-100 %] 99 % (08/16 0537)  Intake/Output from previous day: 08/15 0701 - 08/16 0700 In: -  Out: 725 [Urine:725] Intake/Output this shift:   Nutritional status: General  Past Medical History  Diagnosis Date  . Attention deficit hyperactivity disorder   . GERD (gastroesophageal reflux disease)   . Headache(784.0)     migraines after seizure  . Seizures     grand mal, haven't had a seizure in "a long time"  . Epilepsy   . Arthritis     gout in right big toe    Neurologic Exam:  Mental Status:  Somnolent but could be briefly aroused. He was oriented x3. Speech fluent without evidence of aphasia. Able to follow commands when repeatedly alerted.  Cranial Nerves:  II-Visual fields were normal.  III/IV/VI-Pupils were equal and reacted. Extraocular movements were full and conjugate.  VII- no facial weakness.  VIII-normal.  X-mild dysarthria, commensurate with the level of alertness.  Motor: 5/5 bilaterally with normal tone and bulk  Sensory: Normal throughout.  Deep Tendon Reflexes: Trace to 1+ and symmetric.  Plantars: Flexor bilaterally  Cerebellar: Unable to assess due to somnolence.   Lab Results: No results found for this basename: cbc, bmp, coags, chol, tri, ldl, hga1c   Lipid Panel No results  found for this basename: CHOL, TRIG, HDL, CHOLHDL, VLDL, LDLCALC,  in the last 72 hours  Studies/Results: Ct Cervical Spine Wo Contrast  11/22/2013   CLINICAL DATA:  Seizure,  fall.  Neck pain.  EXAM: CT CERVICAL SPINE WITHOUT CONTRAST  TECHNIQUE: Multidetector CT imaging of the cervical spine was performed without intravenous contrast. Multiplanar CT image reconstructions were also generated.  COMPARISON:  None.  FINDINGS: Loss of normal cervical lordosis. Prevertebral soft tissues are normal. No fracture. No subluxation. No epidural or paraspinal hematoma.  IMPRESSION: No acute bony abnormality.  Cervical straightening which may be positional or related to muscle spasm.   Electronically Signed   By: Charlett NoseKevin  Dover M.D.   On: 11/22/2013 23:33    MEDICATIONS                                                                                                                        Scheduled: . indomethacin  50 mg Oral TID WC  . levETIRAcetam  1,000 mg Intravenous Q12H    ASSESSMENT/PLAN:                                                                                                           28 year old male with known seizure disorder and poor adherence to treatment, admitted with recurrent seizures and virtually undetectable VPA level. Now feels back to baseline. Resume Topamax 100 mg BID. Continue keppra at current dosage, as he seems to tolerate it well. Patient should be able to go home unless there is a medical reason that precludes his discharge today. Neurology will sign off.    Wyatt Portelasvaldo Juleon Narang, MD [facility] [phone]  11/24/2013, 8:42 AM

## 2013-11-24 NOTE — Discharge Summary (Signed)
Physician Discharge Summary  Christopher AltMatthew Vincent Wever ZOX:[id number] DOB: 12/08/1985 DOA: 11/22/2013  PCP: Irving CopasHACKER,ROBERT KELLER, MD  Admit date: 11/22/2013 Discharge date: 11/24/2013  Time spent: 45 minutes  Recommendations for Outpatient Follow-up:  Patient will be discharged home. He is to followup with primary care physician within one week of discharge. Patient should also follow up with neurology as an outpatient. Patient to continue his medications AS PRESCRIBED.    Discharge Diagnoses:  Principal Problem:   Status epilepticus Active Problems:   Lactic acidosis   Gout  Discharge Condition: Stable  Diet recommendation: Regular   Filed Weights   11/22/13 2043  Weight: 102.059 kg (225 lb)    History of present illness:  on 11/23/2013  Christopher Gilbert is a 28 y.o. male with a history of Epilepsy and Gout who suffered a witnessed grand mal seizure while he was at work, lasting close to 18 minutes. He was medicated in the ED and had improved and was about to be discharged to home when he suffered another grand mal seizure and was retained for admission after administration of IV Valium, and then loaded with IV Keppra. Neurology was consulted by the EDP.  Hospital Course:  Status Epilepticus  -Patient was loaded with IV Keppra loaded with IV Depakote  -Will continue seizure precautions and neuro checks  -Neurology consulted and appreciated  -Neurology also recommended topamax BID  -Will transition to PO keppra and topamax  Lactic Acidosis  -Resolved, Likely due to seizures  -Initial lactic acid 12.5, rechecked 0.8   Gout  -Uric acid level: 15.3  -Continue indocin  -Patient should start his allopurinol once his acute gouty flare resolved. Patient to take his medications as prescribed.  Hypokalemia  -Will replace and continue to monitor BMP  Back pain -Patient experienced a fall before coming to the hospital -Patient very nonspecific as to where his pain is  located. -CT spine showed now acute bony abnormality -Cervical/thoracic/lumbar:  No fractures  -PT consulted and patient does not require any outpatient physical therapy.  Procedures: None  Consultations: Neurology  Discharge Exam: Filed Vitals:   11/24/13 0537  BP: 123/74  Pulse: 60  Temp: 97.9 F (36.6 C)  Resp: 18     General: Well developed, well nourished, NAD, appears stated age  HEENT: NCAT, PERRLA, EOMI, Anicteic Sclera, mucous membranes moist.  Cardiovascular: S1 S2 auscultated, no rubs, murmurs or gallops. Regular rate and rhythm.  Respiratory: Clear to auscultation bilaterally with equal chest rise  Abdomen: Soft, obese, nontender, nondistended, + bowel sounds  Extremities: warm dry without cyanosis clubbing or edema  Neuro: AAOx3, cranial nerves grossly intact. Strength 5/5 in patient's upper and lower extremities bilaterally  Discharge Instructions      Discharge Instructions   Discharge instructions    Complete by:  As directed   Patient will be discharged home. He is to followup with primary care physician within one week of discharge. Patient should also follow up with neurology as an outpatient. Patient to continue his medications AS PRESCRIBED.     Increase activity slowly    Complete by:  As directed             Medication List    TAKE these medications       indomethacin 50 MG capsule  Commonly known as:  INDOCIN  Take 1 capsule (50 mg total) by mouth 3 (three) times daily with meals.     topiramate 100 MG tablet  Commonly known as:  TOPAMAX  Take  1 tablet by mouth 2 (two) times daily.      ASK your doctor about these medications       divalproex 500 MG 24 hr tablet  Commonly known as:  DEPAKOTE ER  Take 1 tablet by mouth 2 (two) times daily. Take with the 250mg  to make total of 750mg  per family member     divalproex 250 MG 24 hr tablet  Commonly known as:  DEPAKOTE ER  Take 250 mg by mouth 2 (two) times daily. Takes with the  500mg  to make total of 750mg  per family member       No Known Allergies Follow-up Information   Follow up with Irving Copas, MD. Schedule an appointment as soon as possible for a visit in 1 week. (Hospitalist followup)    Specialty:  Family Medicine   Contact information:   [phone] [address] [place] [address] [phone]        The results of significant diagnostics from this hospitalization (including imaging, microbiology, ancillary and laboratory) are listed below for reference.    Significant Diagnostic Studies: Ct Cervical Spine Wo Contrast  11/22/2013   CLINICAL DATA:  Seizure, fall.  Neck pain.  EXAM: CT CERVICAL SPINE WITHOUT CONTRAST  TECHNIQUE: Multidetector CT imaging of the cervical spine was performed without intravenous contrast. Multiplanar CT image reconstructions were also generated.  COMPARISON:  None.  FINDINGS: Loss of normal cervical lordosis. Prevertebral soft tissues are normal. No fracture. No subluxation. No epidural or paraspinal hematoma.  IMPRESSION: No acute bony abnormality.  Cervical straightening which may be positional or related to muscle spasm.   Electronically Signed   By: Charlett Nose M.D.   On: 11/22/2013 23:33    Microbiology: No results found for this or any previous visit (from the past 240 hour(s)).   Labs: Basic Metabolic Panel:  Recent Labs Lab 11/23/13 0830 11/24/13 1000  NA 142 145  K 3.5* 3.8  CL 105 109  CO2 24 24  GLUCOSE 83 98  BUN 8 9  CREATININE 0.75 0.68  CALCIUM 8.4 8.5   Liver Function Tests: No results found for this basename: AST, ALT, ALKPHOS, BILITOT, PROT, ALBUMIN,  in the last 168 hours No results found for this basename: LIPASE, AMYLASE,  in the last 168 hours No results found for this basename: AMMONIA,  in the last 168 hours CBC:  Recent Labs Lab 11/23/13 0830  WBC 10.0  HGB 14.3  HCT 41.6  MCV 85.2  PLT 230   Cardiac Enzymes:  Recent Labs Lab 11/23/13 0154  CKTOTAL 443*    BNP: BNP (last 3 results) No results found for this basename: PROBNP,  in the last 8760 hours CBG:  Recent Labs Lab 11/22/13 2253  GLUCAP 87       Signed:  Shakeera Rightmyer  [facility] 11/24/2013, 11:11 AM

## 2013-12-31 ENCOUNTER — Encounter (HOSPITAL_COMMUNITY): Payer: Self-pay | Admitting: Emergency Medicine

## 2013-12-31 ENCOUNTER — Emergency Department (HOSPITAL_COMMUNITY)
Admission: EM | Admit: 2013-12-31 | Discharge: 2013-12-31 | Disposition: A | Discharge status: Home / Self Care | Payer: Medicaid Other | Attending: Emergency Medicine | Admitting: Emergency Medicine

## 2013-12-31 DIAGNOSIS — G40909 Epilepsy, unspecified, not intractable, without status epilepticus: Secondary | ICD-10-CM | POA: Insufficient documentation

## 2013-12-31 DIAGNOSIS — Z8719 Personal history of other diseases of the digestive system: Secondary | ICD-10-CM | POA: Insufficient documentation

## 2013-12-31 DIAGNOSIS — M109 Gout, unspecified: Secondary | ICD-10-CM | POA: Insufficient documentation

## 2013-12-31 DIAGNOSIS — F17200 Nicotine dependence, unspecified, uncomplicated: Secondary | ICD-10-CM | POA: Diagnosis not present

## 2013-12-31 DIAGNOSIS — Z79899 Other long term (current) drug therapy: Secondary | ICD-10-CM | POA: Diagnosis not present

## 2013-12-31 DIAGNOSIS — Z8679 Personal history of other diseases of the circulatory system: Secondary | ICD-10-CM | POA: Insufficient documentation

## 2013-12-31 DIAGNOSIS — Z791 Long term (current) use of non-steroidal anti-inflammatories (NSAID): Secondary | ICD-10-CM | POA: Insufficient documentation

## 2013-12-31 DIAGNOSIS — Z8659 Personal history of other mental and behavioral disorders: Secondary | ICD-10-CM | POA: Insufficient documentation

## 2013-12-31 LAB — BASIC METABOLIC PANEL
Anion gap: 11 (ref 5–15)
BUN: 10 mg/dL (ref 6–23)
CALCIUM: 9.6 mg/dL (ref 8.4–10.5)
CHLORIDE: 103 meq/L (ref 96–112)
CO2: 25 mEq/L (ref 19–32)
CREATININE: 0.66 mg/dL (ref 0.50–1.35)
GFR calc Af Amer: >90 mL/min (ref >90)
Glucose, Bld: 92 mg/dL (ref 70–99)
Potassium: 4.1 mEq/L (ref 3.7–5.3)
Sodium: 139 mEq/L (ref 137–147)

## 2013-12-31 LAB — CBC
HCT: 47.6 % (ref 39.0–52.0)
Hemoglobin: 16.4 g/dL (ref 13.0–17.0)
MCH: 29.9 pg (ref 26.0–34.0)
MCHC: 34.5 g/dL (ref 30.0–36.0)
MCV: 86.9 fL (ref 78.0–100.0)
PLATELETS: 222 10*3/uL (ref 150–400)
RBC: 5.48 MIL/uL (ref 4.22–5.81)
RDW: 13.2 % (ref 11.5–15.5)
WBC: 9 10*3/uL (ref 4.0–10.5)

## 2013-12-31 LAB — CBG MONITORING, ED: Glucose-Capillary: 96 mg/dL (ref 70–99)

## 2013-12-31 MED ORDER — LEVETIRACETAM 1000 MG PO TABS: 1000.0000 mg | ORAL_TABLET | Freq: Two times a day (BID) | ORAL | Status: DC

## 2013-12-31 MED ORDER — TOPIRAMATE 25 MG PO TABS
100.0000 mg | ORAL_TABLET | ORAL | Status: AC
  Administered 2013-12-31: 100 mg via ORAL
  Filled 2013-12-31: qty 4

## 2013-12-31 MED ORDER — TOPIRAMATE 100 MG PO TABS: 100.0000 mg | ORAL_TABLET | Freq: Two times a day (BID) | ORAL | Status: DC

## 2013-12-31 MED ORDER — LEVETIRACETAM 500 MG PO TABS
1000.0000 mg | ORAL_TABLET | ORAL | Status: AC
  Administered 2013-12-31: 1000 mg via ORAL
  Filled 2013-12-31 (×2): qty 2

## 2013-12-31 NOTE — ED Provider Notes (Signed)
CSN: [id number]     Arrival date & time 12/31/13  1501 History   First MD Initiated Contact with Patient 12/31/13 1506     Chief Complaint  Patient presents with  . Seizures   (Consider location/radiation/quality/duration/timing/severity/associated sxs/prior Treatment) HPI Christopher Gilbert is a 28 yo male presenting with report of seizure activity about 1 hour ago.  Witnesses report activity lasted about 30 sec to a minute and were involved his total body.  He reports feeling light headed and then doesn't remember anything else.  He currently has a frontal headache he rates  6/10. He denies headache, fever, sob, nausea, vomiting or weakness before this episode.   Past Medical History  Diagnosis Date  . Attention deficit hyperactivity disorder   . GERD (gastroesophageal reflux disease)   . Headache(784.0)     migraines after seizure  . Seizures     grand mal, haven't had a seizure in "a long time"  . Epilepsy   . Arthritis     gout in right big toe   Past Surgical History  Procedure Laterality Date  . Colonoscopy    . Back surgery     No family history on file. History  Substance Use Topics  . Smoking status: Current Every Day Smoker    Types: Cigarettes  . Smokeless tobacco: Never Used  . Alcohol Use: Yes     Comment: occasional/socially    Review of Systems  Constitutional: Negative for fever and chills.  HENT: Negative for sore throat.   Eyes: Negative for visual disturbance.  Respiratory: Negative for cough and shortness of breath.   Cardiovascular: Negative for chest pain and leg swelling.  Gastrointestinal: Negative for nausea, vomiting and diarrhea.  Genitourinary: Negative for dysuria.  Musculoskeletal: Negative for myalgias.  Skin: Negative for rash.  Neurological: Negative for weakness, numbness and headaches.    Allergies  Review of patient's allergies indicates no known allergies.  Home Medications   Prior to Admission medications   Medication  Sig Start Date End Date Taking? Authorizing Provider  indomethacin (INDOCIN) 50 MG capsule Take 1 capsule (50 mg total) by mouth 3 (three) times daily with meals. 11/24/13   Maryann Mikhail, DO  levETIRAcetam (KEPPRA) 1000 MG tablet Take 1 tablet (1,000 mg total) by mouth 2 (two) times daily. 11/24/13   Maryann Mikhail, DO  topiramate (TOPAMAX) 100 MG tablet Take 1 tablet by mouth 2 (two) times daily. 02/18/13   Historical Provider, MD   BP 147/71  Pulse 103  Temp(Src) 98 F (36.7 C) (Oral)  Resp 18  SpO2 94% Physical Exam  Nursing note and vitals reviewed. Constitutional: He appears well-developed and well-nourished. No distress.  HENT:  Head: Normocephalic and atraumatic.  Mouth/Throat: Oropharynx is clear and moist. No oropharyngeal exudate.  Eyes: Conjunctivae are normal.  Neck: Neck supple. No thyromegaly present.  Cardiovascular: Normal rate, regular rhythm and intact distal pulses.   Pulmonary/Chest: Effort normal and breath sounds normal. No respiratory distress. He has no wheezes. He has no rales. He exhibits no tenderness.  Abdominal: Soft. There is no tenderness.  Musculoskeletal: He exhibits no tenderness.  Lymphadenopathy:    He has no cervical adenopathy.  Neurological: He is alert.  Skin: Skin is warm and dry. No rash noted. He is not diaphoretic.  Psychiatric: He has a normal mood and affect.    ED Course  Procedures (including critical care time) Labs Review Labs Reviewed  BASIC METABOLIC PANEL  CBC  CBG MONITORING, ED   Imaging  Review No results found.   EKG Interpretation None      MDM   Final diagnoses:  Seizure disorder   28 yo male with seizure today.  Pt reports he has been taking his Keppra but was unaware he is also should be taking his Topamax.  He did not take his am Keppra dose.  CBC, BMP, and CBG all normal.  Patient with no evidence of focal neuro deficits on physical exam and is at mental baseline.    Pt appears safe to go home.   Discharge instructions include prescription for Topamax and Keppra Patient is advised to establish care with a PCP and neurologist in regards to today's event.  Spoke with patient and family in detail about driving restrictions until cleared by a neurologist.  Patient verbalizes understanding.  Answered all questions.  Patient is hemodynamically stable and in no acute distress prior to discharge.  Return precautions provided.    Filed Vitals:   12/31/13 1615 12/31/13 1645 12/31/13 1713 12/31/13 1715  BP: 129/65 110/55 110/55 102/46  Pulse: 83 86 93   Temp:   97.8 F (36.6 C)   TempSrc:   Oral   Resp: SpO2: 96% 95% 95%     Meds given in ED:  Medications  levETIRAcetam (KEPPRA) tablet 1,000 mg (1,000 mg Oral Given 12/31/13 1657)  topiramate (TOPAMAX) tablet 100 mg (100 mg Oral Given 12/31/13 1659)    Discharge Medication List as of 12/31/2013  5:19 PM    START taking these medications   Details  !! levETIRAcetam (KEPPRA) 1000 MG tablet Take 1 tablet (1,000 mg total) by mouth 2 (two) times daily., Starting 12/31/2013, Until Discontinued, Print    !! topiramate (TOPAMAX) 100 MG tablet Take 1 tablet (100 mg total) by mouth 2 (two) times daily., Starting 12/31/2013, Until Discontinued, Print     !! - Potential duplicate medications found. Please discuss with provider.        Harle Battiest, NP 01/04/14 0025

## 2013-12-31 NOTE — ED Notes (Signed)
Per [facility], pt was at restaurant and had a witnessed seizure. Hx of epilepsy. Switched medications when had a recent visit here for same to Keppra from depakote and topamax. Pt doesn't remember episode, only waking up in back of truck. 18g to LAC. Has injury to right side of tongue. Was incontinent. Bystanders informed EMS it was a full body seizure.

## 2013-12-31 NOTE — Discharge Instructions (Signed)
Please follow the directions provided.  Be sure to follow up with your primary care provider and use the referral to establish care with a neurologist.  Be sure you do not drive until you are cleared by a neurologist.   SEEK MEDICAL CARE IF:  You develop signs of infection or other illness. This might increase the risk of a seizure.  You seem to be having more frequent seizures.  Your seizure pattern is changing.  SEEK IMMEDIATE MEDICAL CARE IF:  You have a seizure that does not stop after a few moments.  You have a seizure that causes any difficulty in breathing.  You have a seizure that results in a very severe headache.  You have a seizure that leaves you with the inability to speak or use a part of your body   Emergency Department Resource Guide 1) Find a Doctor and Pay Out of Pocket Although you won't have to find out who is covered by your insurance plan, it is a good idea to ask around and get recommendations. You will then need to call the office and see if the doctor you have chosen will accept you as a new patient and what types of options they offer for patients who are self-pay. Some doctors offer discounts or will set up payment plans for their patients who do not have insurance, but you will need to ask so you aren't surprised when you get to your appointment.  2) Contact Your Local Health Department Not all health departments have doctors that can see patients for sick visits, but many do, so it is worth a call to see if yours does. If you don't know where your local health department is, you can check in your phone book. The CDC also has a tool to help you locate your state's health department, and many state websites also have listings of all of their local health departments.  3) Find a Walk-in Clinic If your illness is not likely to be very severe or complicated, you may want to try a walk in clinic. These are popping up all over the country in pharmacies, drugstores, and  shopping centers. They're usually staffed by nurse practitioners or physician assistants that have been trained to treat common illnesses and complaints. They're usually fairly quick and inexpensive. However, if you have serious medical issues or chronic medical problems, these are probably not your best option.  No Primary Care Doctor: - Call Health Connect at  [phone] - they can help you locate a primary care doctor that  accepts your insurance, provides certain services, etc. - Physician Referral Service- [phone]  Chronic Pain Problems: Organization         Address  Phone   Notes  [facility]  [phone] Patients need to be referred by their primary care doctor.   Medication Assistance: Organization         Address  Phone   Notes  [facility] Medication [facility] [address] [place] [place], [place] [address] [phone] --Must be a resident of [facility] -- Must have NO insurance coverage whatsoever (no Medicaid/ Medicare, etc.) -- The pt. MUST have a primary care doctor that directs their care regularly and follows them in the community   MedAssist  [phone]   [employer]  [phone]    Agencies that provide inexpensive medical care: Organization         Address  Phone   Notes  Patrcia Dolly  [facility] Family Medicine  [phone]   [facility] Internal Medicine    [phone]   [facility] [address] [place], [place] [address] [phone]   [facility] [address] [place], [place] [phone]   Planned Parenthood    [phone]   [facility]    [phone]   [facility]  [address], [place] Phone:  [phone], Fax:  [phone] Hours of Operation:  9 am - 6 pm, M-F.  Also accepts Medicaid/Medicare and self-pay.  [facility]  [address], [place] Phone: [phone],  Fax: [phone]. Hours of Operation:  8:30 am - 5:30 pm, M-F.  Also accepts Medicaid and self-pay.  [facility] [place] [address] [place] Phone: [phone]   [facility] [address] Natasha Bence [place], [place] [phone], Ext. [phone] Mondays & Thursdays: 7-9 AM.  First 15 patients are seen on a first come, first serve basis.    Medicaid-accepting [facility] Providers:  Organization         Address  Phone   Notes  [facility] [address], [place] [phone] Also accepts self-pay patients.  [facility] [address] Laurell Josephs [place], [place]  [phone]   [facility] [address], [place] [phone]   [facility] Family Medicine [address], [place] [phone]   Renaye Rakers [address], [place]   [phone] Only accepts [place] Access [place] patients after they have their name applied to their card.   Self-Pay (no insurance) in [facility]:  Organization         Address  Phone   Notes  Sickle Cell Patients, [facility] Internal Medicine [address] [place], [place] [phone]   [facility] Urgent Care [address] [place], [place] [phone]   [facility]  [address], [place] [phone]   [facility]/Dr. Osei-Bonsu  [address], [place] or [address], [place] [phone] Phone number for both [place] and [place] locations is the same.  [facility]gent Medical and [facility] [address], [place] [phone]   [facility] [address] [place] or [address] [phone]   [facility] [address], [place] [phone], phone; [phone], fax Sees patients 1st and 3rd Saturday of every month.  Must not qualify for public or private  insurance (i.e. Medicaid, Medicare, [place] Health Choice, Veterans' Benefits)  Household income should be no more than 200% of the poverty level The clinic cannot treat you if you are pregnant or think you are pregnant  Sexually transmitted diseases are not treated at the clinic.    Dental Care: Organization         Address  Phone  Notes  [facility] Department of [facility] [address] [place], [place] [phone] Accepts children up to age 5 who are enrolled in [place] or [place] Health Choice; pregnant women with a Medicaid card; and children who have applied for Medicaid or [place] Health Choice, but were declined, whose parents can pay a reduced fee at time of service.  [facility] [employer] [place]  [address], [place] [phone] Accepts children up to age 78 who are enrolled in Medicaid or [place] Health Choice; pregnant women with a Medicaid card; and children who have applied for Medicaid or [place] Health Choice, but were declined, whose parents can pay a reduced fee at time of service.  [facility]  [address] [place], [place] [phone] Patients are seen by appointment only. Walk-ins are not accepted. [facility] will see patients 31 years of age and older. Monday - Tuesday (8am-5pm) Most Wednesdays (8:30-5pm) $30 per visit, cash only  [facility]  [address], [facility] [phone] Patients are seen by appointment only. Walk-ins are not accepted. [facility] will see patients 38 years of age and older. One Wednesday Evening (Monthly: Volunteer Based).  $30 per visit, cash only  [employer]  [phone] for adults; Children under age 68, call Graduate Pediatric Dentistry at [phone]. Children aged 78-14, please call [phone] to request a pediatric application.  Dental services are provided in all areas of dental care  including fillings, crowns and bridges, complete and partial dentures, implants, gum treatment, root canals, and extractions. Preventive care is also provided. Treatment is provided to both adults and children. Patients are selected via a lottery and there is often a waiting list.   [facility] [address] [place]  [phone] [email or web address]   [facility] [address] [place], [place] [phone], Ext. [phone] Second and Fourth Thursday of each month, opens at 6:30 AM; Clinic ends at 9 AM.  Patients are seen on a first-come first-served basis, and a limited number are seen during each clinic.   [facility]  [address] Ether Griffins [place], [place] [phone]   Eligibility Requirements You must have lived in [place], [place], or [place] counties for at least the last three months.   You cannot be eligible for state or federal sponsored [place], including [employer], [place], or [employer].   You generally cannot be eligible for healthcare insurance through your employer.    How to apply: Eligibility screenings are held every Tuesday and Wednesday afternoon from 1:00 pm until 4:00 pm. You do not need an appointment for the interview!  [facility] [address], [place], [place] [phone]   [facility] Health Department  [phone]   [facility] Health Department  [phone]   [facility] Health Department  [phone]    Behavioral Health Resources in the Community: Intensive Outpatient Programs Organization         Address  Phone  Notes  [facility] [address] [place], [place] [phone]   [facility] [address], [place], [place] [phone]   [facility] [address] [place], [place]  [phone]   [facility] Mental Health [address]  [place], [place] [phone] or [phone]     Substance Abuse Resources Organization         Address  Phone  Notes  [facility] Drug Services  [phone]   [facility]  [phone]   [facility] [place]  [phone]   Floydene Flock  [phone]   Residential & Outpatient Substance Abuse Program  [phone]   Psychological Services Organization         Address  Phone  Notes  [facility] Behavioral Health  [phone]   [employer]   [facility] Mental Health [address] [place] [phone] or [phone]    Mobile Crisis Teams Organization         Address  Phone  Notes  [facility], Mobile Crisis Care Unit  [phone]   [facility]  [address] [place], [place] [phone]   Doristine Locks [address] [place] [phone]    Self-Help/Support Groups Organization         Address  Phone             Notes  [facility] [place] - variety of support groups  336- I7437963 Call for more information  Narcotics Anonymous (NA), [facility] [address], [employer] [place]  2 meetings at this location   Statistician         Address  Phone  Notes  [facility] [address],    [place]  [phone]   [facility]  [address], [place] [address], [place], [place] [phone]   [facility] [address] [place], [place] [phone] Admissions: 8am-3pm M-F  [facility] [address],    [place], [place] [phone]   [facility] [address] [place], [place], [place] [phone]   [facility] [address],  [place], [place] [phone]   [facility] - Intensive Outpatient [address]ure[address], [place], [place] [phone]   [facility] ([facility]iction [facility] [address] [place]  [place], [place] [phone] or [phone]   [facility]  Services (RTS) [address] [place], [place] [phone] Accepts Medicaid  [facility] [address]  [place] [phone] Substance Abuse/Addiction Treatment   [facility] Organization         Address  Phone  Notes  [facility]  [phone]   Angie Fava, PhD [address] Ervin Knack [place], [place]   (340)[phone] or [phone]   [facility] Behavioral   [address] [place], [place] [phone]   [facility] [address], [place], [place] [phone] Insurance/Medicaid/sponsorship through [facility] [address]                                    [place], [place] [phone] Therapy/tele-psych/case  [facility] [address] [place], [place] [phone]    Dr. Lolly Mustache  [phone]   [facility]  [employer] [facility] [place] [facility] Dept. 1) [address], [place] 2) [address] [place] 3)  [address], [place] [phone]  [phone]   [facility] Child Abuse Hotline [phone]) [phone] or [phone] (After Hours)

## 2014-01-04 NOTE — ED Provider Notes (Signed)
Medical screening examination/treatment/procedure(s) were conducted as a shared visit with non-physician practitioner(s) and myself.  I personally evaluated the patient during the encounter.  Pt with hx, recent non compliance w rx, c/o seizure. Now at baseline. Alert, oriented. Spine nt. Chest cta.    Suzi Roots, MD 01/04/14 [phone]

## 2014-02-11 ENCOUNTER — Ambulatory Visit: Payer: Medicaid Other | Attending: Internal Medicine | Admitting: Internal Medicine

## 2014-02-11 ENCOUNTER — Encounter: Payer: Self-pay | Admitting: Internal Medicine

## 2014-02-11 ENCOUNTER — Ambulatory Visit (HOSPITAL_BASED_OUTPATIENT_CLINIC_OR_DEPARTMENT_OTHER): Payer: PRIVATE HEALTH INSURANCE | Admitting: *Deleted

## 2014-02-11 VITALS — BP 127/85 | HR 94 | Temp 97.9°F | Resp 16 | Ht 72.0 in | Wt 250.0 lb

## 2014-02-11 DIAGNOSIS — F17210 Nicotine dependence, cigarettes, uncomplicated: Secondary | ICD-10-CM | POA: Insufficient documentation

## 2014-02-11 DIAGNOSIS — G40309 Generalized idiopathic epilepsy and epileptic syndromes, not intractable, without status epilepticus: Secondary | ICD-10-CM

## 2014-02-11 DIAGNOSIS — Z23 Encounter for immunization: Secondary | ICD-10-CM | POA: Diagnosis not present

## 2014-02-11 DIAGNOSIS — G40409 Other generalized epilepsy and epileptic syndromes, not intractable, without status epilepticus: Secondary | ICD-10-CM

## 2014-02-11 MED ORDER — DIVALPROEX SODIUM 250 MG PO DR TAB: 250.0000 mg | DELAYED_RELEASE_TABLET | Freq: Two times a day (BID) | ORAL | Status: DC

## 2014-02-11 MED ORDER — DIVALPROEX SODIUM 500 MG PO DR TAB: 500.0000 mg | DELAYED_RELEASE_TABLET | Freq: Two times a day (BID) | ORAL | Status: DC

## 2014-02-11 MED ORDER — TOPIRAMATE 100 MG PO TABS: 100.0000 mg | ORAL_TABLET | Freq: Two times a day (BID) | ORAL | Status: DC

## 2014-02-11 NOTE — Patient Instructions (Signed)

## 2014-02-11 NOTE — Progress Notes (Signed)
Patient ID: Christopher Gilbert, male   DOB: 11/24/1985, 28 y.o.   MRN: [id number]   Christopher Gilbert, is a 28 y.o. male  [id number]  [id number]  DOB - 05/25/1985  CC:  Chief Complaint  Patient presents with  . Establish Care       HPI: Christopher PinaMatthew Gilbert is a 28 y.o. male here today to establish medical care. Patient has history of seizure disorder, attention deficit hyperactive disorder, GERD and chronic headache. He is here for medication refills. He has been referred to neurologist from the hospital admission in August. He has no new complaint today. He has been taking medications as recommended by neurologist. Patient has No headache today, No chest pain, No abdominal pain - No Nausea, No new weakness tingling or numbness, No Cough - SOB.  No Known Allergies Past Medical History  Diagnosis Date  . Attention deficit hyperactivity disorder   . GERD (gastroesophageal reflux disease)   . Headache(784.0)     migraines after seizure  . Seizures     grand mal, haven't had a seizure in "a long time"  . Epilepsy   . Arthritis     gout in right big toe   Current Outpatient Prescriptions on File Prior to Visit  Medication Sig Dispense Refill  . indomethacin (INDOCIN) 50 MG capsule Take 1 capsule (50 mg total) by mouth 3 (three) times daily with meals. 18 capsule 0   No current facility-administered medications on file prior to visit.   Family History  Problem Relation Age of Onset  . Diabetes Paternal Grandmother    History   Social History  . Marital Status: Single    Spouse Name: N/A    Number of Children: N/A  . Years of Education: N/A   Occupational History  . Not on file.   Social History Main Topics  . Smoking status: Current Every Day Smoker    Types: Cigarettes  . Smokeless tobacco: Never Used  . Alcohol Use: Yes     Comment: occasional/socially  . Drug Use: No  . Sexual Activity: Yes   Other Topics Concern  . Not on file   Social History Narrative     Review of Systems: Constitutional: Negative for fever, chills, diaphoresis, activity change, appetite change and fatigue. HENT: Negative for ear pain, nosebleeds, congestion, facial swelling, rhinorrhea, neck pain, neck stiffness and ear discharge.  Eyes: Negative for pain, discharge, redness, itching and visual disturbance. Respiratory: Negative for cough, choking, chest tightness, shortness of breath, wheezing and stridor.  Cardiovascular: Negative for chest pain, palpitations and leg swelling. Gastrointestinal: Negative for abdominal distention. Genitourinary: Negative for dysuria, urgency, frequency, hematuria, flank pain, decreased urine volume, difficulty urinating and dyspareunia.  Musculoskeletal: Negative for back pain, joint swelling, arthralgia and gait problem. Neurological: Negative for dizziness, tremors, seizures, syncope, facial asymmetry, speech difficulty, weakness, light-headedness, numbness and headaches.  Hematological: Negative for adenopathy. Does not bruise/bleed easily. Psychiatric/Behavioral: Negative for hallucinations, behavioral problems, confusion, dysphoric mood, decreased concentration and agitation.    Objective:   Filed Vitals:   02/11/14 1119  BP: 127/85  Pulse: 94  Temp: 97.9 F (36.6 C)  Resp: 16    Physical Exam: Constitutional: Patient appears well-developed and well-nourished. No distress. HENT: Normocephalic, atraumatic, External right and left ear normal. Oropharynx is clear and moist.  Eyes: Conjunctivae and EOM are normal. PERRLA, no scleral icterus. Neck: Normal ROM. Neck supple. No JVD. No tracheal deviation. No thyromegaly. CVS: RRR, S1/S2 +, no murmurs, no gallops, no carotid bruit.  Pulmonary: Effort and breath sounds normal, no stridor, rhonchi, wheezes, rales.  Abdominal: Soft. BS +, no distension, tenderness, rebound or guarding.  Musculoskeletal: Normal range of motion. No edema and no tenderness.  Lymphadenopathy: No  lymphadenopathy noted, cervical, inguinal or axillary Neuro: Alert. Normal reflexes, muscle tone coordination. No cranial nerve deficit. Skin: Skin is warm and dry. No rash noted. Not diaphoretic. No erythema. No pallor. Psychiatric: Normal mood and affect. Behavior, judgment, thought content normal.  Lab Results  Component Value Date   WBC 9.0 12/31/2013   HGB 16.4 12/31/2013   HCT 47.6 12/31/2013   MCV 86.9 12/31/2013   PLT 222 12/31/2013   Lab Results  Component Value Date   CREATININE 0.66 12/31/2013   BUN 10 12/31/2013   NA 139 12/31/2013   K 4.1 12/31/2013   CL 103 12/31/2013   CO2 25 12/31/2013    No results found for: HGBA1C Lipid Panel  No results found for: CHOL, TRIG, HDL, CHOLHDL, VLDL, LDLCALC     Assessment and plan:   1. Epilepsy, grand mal  - topiramate (TOPAMAX) 100 MG tablet; Take 1 tablet (100 mg total) by mouth 2 (two) times daily.  Dispense: 180 tablet; Refill: 3 - divalproex (DEPAKOTE) 250 MG DR tablet; Take 1 tablet (250 mg total) by mouth 2 (two) times daily.  Dispense: 180 tablet; Refill: 3 - divalproex (DEPAKOTE) 500 MG DR tablet; Take 1 tablet (500 mg total) by mouth 2 (two) times daily.  Dispense: 180 tablet; Refill: 3   Return in about 6 months (around 08/12/2014), or if symptoms worsen or fail to improve, for Epilepsy.  The patient was given clear instructions to go to ER or return to medical center if symptoms don't improve, worsen or new problems develop. The patient verbalized understanding. The patient was told to call to get lab results if they haven't heard anything in the next week.     This note has been created with Education officer, [employer]Dragon speech recognition software and smart phrase technology. Any transcriptional errors are unintentional.    Jeanann LewandowskyJEGEDE, Jasmyn Picha, MD, MHA, FACP, FAAP [facility] [place] , [place] [phone]   02/11/2014, 11:52 AM

## 2014-02-11 NOTE — Progress Notes (Signed)
Pt is here to establish care. Pt has a history of seizures and has had a spinal fusion. Pt is currently smoking.

## 2014-05-25 ENCOUNTER — Encounter (HOSPITAL_COMMUNITY): Payer: Self-pay | Admitting: Emergency Medicine

## 2014-05-25 ENCOUNTER — Emergency Department (HOSPITAL_COMMUNITY)
Admission: EM | Admit: 2014-05-25 | Discharge: 2014-05-26 | Disposition: A | Discharge status: Home / Self Care | Payer: Medicaid Other | Attending: Emergency Medicine | Admitting: Emergency Medicine

## 2014-05-25 ENCOUNTER — Emergency Department (HOSPITAL_COMMUNITY): Payer: Medicaid Other

## 2014-05-25 DIAGNOSIS — Y9289 Other specified places as the place of occurrence of the external cause: Secondary | ICD-10-CM | POA: Insufficient documentation

## 2014-05-25 DIAGNOSIS — Z8719 Personal history of other diseases of the digestive system: Secondary | ICD-10-CM | POA: Diagnosis not present

## 2014-05-25 DIAGNOSIS — M1990 Unspecified osteoarthritis, unspecified site: Secondary | ICD-10-CM | POA: Diagnosis not present

## 2014-05-25 DIAGNOSIS — G43909 Migraine, unspecified, not intractable, without status migrainosus: Secondary | ICD-10-CM | POA: Diagnosis not present

## 2014-05-25 DIAGNOSIS — Z791 Long term (current) use of non-steroidal anti-inflammatories (NSAID): Secondary | ICD-10-CM | POA: Diagnosis not present

## 2014-05-25 DIAGNOSIS — X58XXXA Exposure to other specified factors, initial encounter: Secondary | ICD-10-CM | POA: Diagnosis not present

## 2014-05-25 DIAGNOSIS — Y9389 Activity, other specified: Secondary | ICD-10-CM | POA: Insufficient documentation

## 2014-05-25 DIAGNOSIS — R1011 Right upper quadrant pain: Secondary | ICD-10-CM

## 2014-05-25 DIAGNOSIS — Z8659 Personal history of other mental and behavioral disorders: Secondary | ICD-10-CM | POA: Diagnosis not present

## 2014-05-25 DIAGNOSIS — S3991XA Unspecified injury of abdomen, initial encounter: Secondary | ICD-10-CM | POA: Insufficient documentation

## 2014-05-25 DIAGNOSIS — R109 Unspecified abdominal pain: Secondary | ICD-10-CM

## 2014-05-25 DIAGNOSIS — Y998 Other external cause status: Secondary | ICD-10-CM | POA: Insufficient documentation

## 2014-05-25 DIAGNOSIS — G40909 Epilepsy, unspecified, not intractable, without status epilepticus: Secondary | ICD-10-CM | POA: Insufficient documentation

## 2014-05-25 DIAGNOSIS — Z720 Tobacco use: Secondary | ICD-10-CM | POA: Insufficient documentation

## 2014-05-25 DIAGNOSIS — S3992XA Unspecified injury of lower back, initial encounter: Secondary | ICD-10-CM | POA: Diagnosis present

## 2014-05-25 DIAGNOSIS — Z79899 Other long term (current) drug therapy: Secondary | ICD-10-CM | POA: Insufficient documentation

## 2014-05-25 DIAGNOSIS — S39012A Strain of muscle, fascia and tendon of lower back, initial encounter: Secondary | ICD-10-CM | POA: Diagnosis not present

## 2014-05-25 DIAGNOSIS — R1010 Upper abdominal pain, unspecified: Secondary | ICD-10-CM

## 2014-05-25 LAB — COMPREHENSIVE METABOLIC PANEL
ALBUMIN: 4.2 g/dL (ref 3.5–5.2)
ALK PHOS: 49 U/L (ref 39–117)
ALT: 36 U/L (ref 0–53)
AST: 23 U/L (ref 0–37)
Anion gap: 4 — ABNORMAL LOW (ref 5–15)
BUN: 15 mg/dL (ref 6–23)
CO2: 24 mmol/L (ref 19–32)
Calcium: 9.1 mg/dL (ref 8.4–10.5)
Chloride: 112 mmol/L (ref 96–112)
Creatinine, Ser: 0.95 mg/dL (ref 0.50–1.35)
GFR calc Af Amer: >90 mL/min (ref >90)
GFR calc non Af Amer: >90 mL/min (ref >90)
GLUCOSE: 106 mg/dL — AB (ref 70–99)
Potassium: 4 mmol/L (ref 3.5–5.1)
SODIUM: 140 mmol/L (ref 135–145)
Total Bilirubin: 0.7 mg/dL (ref 0.3–1.2)
Total Protein: 7.1 g/dL (ref 6.0–8.3)

## 2014-05-25 LAB — URINALYSIS, ROUTINE W REFLEX MICROSCOPIC
Bilirubin Urine: NEGATIVE
Glucose, UA: NEGATIVE mg/dL
Hgb urine dipstick: NEGATIVE
Ketones, ur: NEGATIVE mg/dL
LEUKOCYTES UA: NEGATIVE
Nitrite: NEGATIVE
PH: 7 (ref 5.0–8.0)
Protein, ur: NEGATIVE mg/dL
SPECIFIC GRAVITY, URINE: 1.021 (ref 1.005–1.030)
Urobilinogen, UA: 1 mg/dL (ref 0.0–1.0)

## 2014-05-25 LAB — CBC WITH DIFFERENTIAL/PLATELET
Basophils Absolute: 0.1 10*3/uL (ref 0.0–0.1)
Basophils Relative: 1 % (ref 0–1)
EOS ABS: 0.1 10*3/uL (ref 0.0–0.7)
Eosinophils Relative: 1 % (ref 0–5)
HCT: 46.3 % (ref 39.0–52.0)
HEMOGLOBIN: 16.3 g/dL (ref 13.0–17.0)
Lymphocytes Relative: 31 % (ref 12–46)
Lymphs Abs: 3.1 10*3/uL (ref 0.7–4.0)
MCH: 30.1 pg (ref 26.0–34.0)
MCHC: 35.2 g/dL (ref 30.0–36.0)
MCV: 85.6 fL (ref 78.0–100.0)
MONO ABS: 0.8 10*3/uL (ref 0.1–1.0)
Monocytes Relative: 8 % (ref 3–12)
NEUTROS ABS: 5.8 10*3/uL (ref 1.7–7.7)
NEUTROS PCT: 59 % (ref 43–77)
PLATELETS: 248 10*3/uL (ref 150–400)
RBC: 5.41 MIL/uL (ref 4.22–5.81)
RDW: 13.1 % (ref 11.5–15.5)
WBC: 10 10*3/uL (ref 4.0–10.5)

## 2014-05-25 LAB — LIPASE, BLOOD: Lipase: 29 U/L (ref 11–59)

## 2014-05-25 MED ORDER — IOHEXOL 300 MG/ML  SOLN
100.0000 mL | Freq: Once | INTRAMUSCULAR | Status: AC | PRN
Start: 2014-05-25 — End: 2014-05-25
  Administered 2014-05-25: 100 mL via INTRAVENOUS

## 2014-05-25 MED ORDER — SODIUM CHLORIDE 0.9 % IV BOLUS (SEPSIS)
1000.0000 mL | Freq: Once | INTRAVENOUS | Status: AC
  Administered 2014-05-25: 1000 mL via INTRAVENOUS

## 2014-05-25 MED ORDER — IOHEXOL 300 MG/ML  SOLN
25.0000 mL | Freq: Once | INTRAMUSCULAR | Status: AC | PRN
  Administered 2014-05-25: 25 mL via ORAL

## 2014-05-25 MED ORDER — HYDROMORPHONE HCL 1 MG/ML IJ SOLN
0.5000 mg | Freq: Once | INTRAMUSCULAR | Status: AC
  Administered 2014-05-25: 0.5 mg via INTRAVENOUS
  Filled 2014-05-25: qty 1

## 2014-05-25 MED ORDER — IBUPROFEN 800 MG PO TABS: 800.0000 mg | ORAL_TABLET | Freq: Three times a day (TID) | ORAL | Status: DC

## 2014-05-25 MED ORDER — CYCLOBENZAPRINE HCL 10 MG PO TABS: 10.0000 mg | ORAL_TABLET | Freq: Two times a day (BID) | ORAL | Status: DC | PRN

## 2014-05-25 MED ORDER — HYDROCODONE-ACETAMINOPHEN 5-325 MG PO TABS
1.0000 | ORAL_TABLET | Freq: Four times a day (QID) | ORAL | Status: DC | PRN
Start: 2014-05-25 — End: 2015-05-08

## 2014-05-25 NOTE — ED Notes (Signed)
Pt ambulated to bathroom and back. O2 was 99-100% all the way there and back and HR was 95

## 2014-05-25 NOTE — ED Notes (Signed)
Pt c/o low back pain x 3-4 days. Pt has been starting a work out Higher education careers adviserplan.

## 2014-05-25 NOTE — ED Provider Notes (Signed)
CSN: [id number]     Arrival date & time 05/25/14  1843 History  This chart was scribed for Mellody DrownLauren Daniell Mancinas, PA-C, working with Doug SouSam Jacubowitz, MD by Jolene Provostobert Halas, ED Scribe. This patient was seen in room TR07C/TR07C and the patient's care was started at 7:46 PM.     Chief Complaint  Patient presents with  . Back Pain   HPI Comments: Christopher Gilbert is a 29 y.o. male with a past medical history of back surgery and renal calculi who presents to the Emergency Department complaining of low back pain that started four days ago. He reports discomfort with radiation into his chest and abdomen since yesterday.  He reports chest discomfort with pain and associated dyspnea. Has resolved denies chest pain or shortness of breath this time. Pt states he had an effusion surgery in December 2014. Pt endorses associated abdominal pain, loose stools, and pain with expiration. Pt denies nausea vomiting, loss of bowel or bladder control, urinary hesitancy. Pt states he has had a kidney stone in the past. Pt's PCP is Dr. Marjorie SmolderJugede. Pt has KNDA. No abdominal surgeries.    Patient is a 29 y.o. male presenting with back pain. The history is provided by the patient. No language interpreter was used.  Back Pain Associated symptoms: abdominal pain   Associated symptoms: no dysuria and no fever     Past Medical History  Diagnosis Date  . Attention deficit hyperactivity disorder   . GERD (gastroesophageal reflux disease)   . Headache(784.0)     migraines after seizure  . Seizures     grand mal, haven't had a seizure in "a long time"  . Epilepsy   . Arthritis     gout in right big toe   Past Surgical History  Procedure Laterality Date  . Colonoscopy    . Back surgery     Family History  Problem Relation Age of Onset  . Diabetes Paternal Grandmother    History  Substance Use Topics  . Smoking status: Current Every Day Smoker    Types: Cigarettes  . Smokeless tobacco: Never Used  . Alcohol Use: Yes      Comment: occasional/socially    Review of Systems  Constitutional: Negative for fever and chills.  Gastrointestinal: Positive for abdominal pain and diarrhea. Negative for nausea, vomiting, constipation and blood in stool.       Loose stools.  Genitourinary: Negative for dysuria, frequency, hematuria and difficulty urinating.  Musculoskeletal: Positive for back pain.      Allergies  Review of patient's allergies indicates no known allergies.  Home Medications   Prior to Admission medications   Medication Sig Start Date End Date Taking? Authorizing Provider  divalproex (DEPAKOTE) 250 MG DR tablet Take 1 tablet (250 mg total) by mouth 2 (two) times daily. 02/11/14   Quentin Angstlugbemiga E Jegede, MD  divalproex (DEPAKOTE) 500 MG DR tablet Take 1 tablet (500 mg total) by mouth 2 (two) times daily. 02/11/14   Quentin Angstlugbemiga E Jegede, MD  indomethacin (INDOCIN) 50 MG capsule Take 1 capsule (50 mg total) by mouth 3 (three) times daily with meals. 11/24/13   Maryann Mikhail, DO  topiramate (TOPAMAX) 100 MG tablet Take 1 tablet (100 mg total) by mouth 2 (two) times daily. 02/11/14   Quentin Angstlugbemiga E Jegede, MD   BP 121/75 mmHg  Pulse 91  Temp(Src) 97.7 F (36.5 C) (Oral)  Resp 18  Ht 6' (1.829 m)  Wt 240 lb (108.863 kg)  BMI 32.54 kg/m2  SpO2 98%  Physical Exam  Constitutional: He is oriented to person, place, and time. He appears well-developed and well-nourished. No distress.  HENT:  Head: Normocephalic and atraumatic.  Eyes: No scleral icterus.  Neck: Neck supple.  Cardiovascular: Normal rate and regular rhythm.   Pulmonary/Chest: Effort normal and breath sounds normal. No respiratory distress. He has no wheezes. He has no rales.  Abdominal: Soft. He exhibits no distension. There is tenderness in the right upper quadrant. There is no rebound and no CVA tenderness.  Questionable Murphy's sign.  Musculoskeletal:       Thoracic back: He exhibits tenderness.       Back:  Discomfort reproducible with  palpation of right mid paraspinal muscles.  Normal gait unassisted.  Neurological: He is alert and oriented to person, place, and time.  Skin: Skin is warm and dry. He is not diaphoretic.  Psychiatric: He has a normal mood and affect. His behavior is normal.  Nursing note and vitals reviewed.   ED Course  Procedures  DIAGNOSTIC STUDIES: Oxygen Saturation is 98% on RA, normal by my interpretation.    COORDINATION OF CARE: 7:52 PM Discussed treatment plan with pt at bedside and pt agreed to plan.  Labs Review Labs Reviewed  COMPREHENSIVE METABOLIC PANEL - Abnormal; Notable for the following:    Glucose, Bld 106 (*)    Anion gap 4 (*)    All other components within normal limits  CBC WITH DIFFERENTIAL/PLATELET  LIPASE, BLOOD  URINALYSIS, ROUTINE W REFLEX MICROSCOPIC    Imaging Review Ct Abdomen Pelvis W Contrast  05/25/2014   CLINICAL DATA:  Severe back and abdominal pain, diarrhea for 3-4 days. History of gastroesophageal reflux disease, lumbar fusion.  EXAM: CT ABDOMEN AND PELVIS WITH CONTRAST  TECHNIQUE: Multidetector CT imaging of the abdomen and pelvis was performed using the standard protocol following bolus administration of intravenous contrast.  CONTRAST:  25mL OMNIPAQUE IOHEXOL 300 MG/ML SOLN, OMNIPAQUE IOHEXOL 300 MG/ML SOLN  COMPARISON:  Lumbar spine radiograph November 24, 2013  FINDINGS: LUNG BASES: Included view of the lung bases are clear. Visualized heart and pericardium are unremarkable.  SOLID ORGANS: The liver, spleen, pancreas and adrenal glands are unremarkable. Contracted gallbladder.  GASTROINTESTINAL TRACT: The stomach, small and large bowel are normal in course and caliber without inflammatory changes. Normal appendix.  KIDNEYS/ URINARY TRACT: Kidneys are orthotopic, demonstrating symmetric enhancement. No nephrolithiasis, hydronephrosis or solid renal masses. Too small to characterize hypodensity upper pole RIGHT kidney. The unopacified ureters are normal in  course and caliber. Urinary bladder is partially distended and unremarkable.  PERITONEUM/RETROPERITONEUM: Aortoiliac vessels are normal in course and caliber. No lymphadenopathy by CT size criteria. Internal reproductive organs are unremarkable. No intraperitoneal free fluid nor free air.  SOFT TISSUE/OSSEOUS STRUCTURES: Non-suspicious. Mild broad thoracolumbar levoscoliosis. Status post L5-S1 PLIF, posterior decompression, incorporated posterior bone graft material. Partially incorporated interbody fusion material. Grade 2 L5-S1 anterolisthesis, similar.  IMPRESSION: No acute intra-abdominal pelvic process.   Electronically Signed   By: Awilda Metro   On: 05/25/2014 23:29     EKG Interpretation None      MDM   Final diagnoses:  Abdominal pain  Right upper quadrant abdominal pain  Back strain, initial encounter   The patient is a 29 year old male with a past medical history of back surgery presents with mid right back pain reproducible with palpation. He also complains of discomfort radiating into the right upper abdomen with tenderness to palpation  with a questional Murphy sign. Labs without concerning abnormalities, urine  negative for blood. Reevaluation patient laying in room comfortably he reports mild resolution of back pain with medication he reports persistent right upper quadrant pain.  Repeat abdominal exam shows right upper quadrant pain. Plan to CT. CT negative for acute findings, discussed a small hypodense lesion in the right kidney. Plan to discharge home with pain medication and PCP follow-up as well as back specialist follow-up. Discussed lab results, imaging results, and treatment plan with the patient. Return precautions given. Reports understanding and no other concerns at this time.  Patient is stable for discharge at this time.  Meds given in ED:  Medications  sodium chloride 0.9 % bolus 1,000 mL (0 mLs Intravenous Stopped 05/25/14 2143)  HYDROmorphone (DILAUDID)  injection 0.5 mg (0.5 mg Intravenous Given 05/25/14 2013)  sodium chloride 0.9 % bolus 1,000 mL (0 mLs Intravenous Stopped 05/25/14 2221)  iohexol (OMNIPAQUE) 300 MG/ML solution 25 mL (25 mLs Oral Contrast Given 05/25/14 2209)  iohexol (OMNIPAQUE) 300 MG/ML solution 100 mL (100 mLs Intravenous Contrast Given 05/25/14 2305)    Discharge Medication List as of 05/25/2014 11:55 PM    START taking these medications   Details  cyclobenzaprine (FLEXERIL) 10 MG tablet Take 1 tablet (10 mg total) by mouth 2 (two) times daily as needed for muscle spasms., Starting 05/25/2014, Until Discontinued, Print    HYDROcodone-acetaminophen (NORCO/VICODIN) 5-325 MG per tablet Take 1 tablet by mouth every 6 (six) hours as needed for moderate pain or severe pain., Starting 05/25/2014, Until Discontinued, Print    ibuprofen (ADVIL,MOTRIN) 800 MG tablet Take 1 tablet (800 mg total) by mouth 3 (three) times daily., Starting 05/25/2014, Until Discontinued, Print       I personally performed the services described in this documentation, which was scribed in my presence. The recorded information has been reviewed and is accurate.      Mellody Drown, PA-C 05/26/14 0037  Mellody Drown, PA-C 05/26/14 1610  Doug Sou, MD 05/26/14 [phone]

## 2014-05-25 NOTE — Discharge Instructions (Signed)
Call for a follow up appointment with a Family or Primary Care Provider.  Return if Symptoms worsen.   Take medication as prescribed.    Ice your back 3-4 times a day. Take ibuprofen for discomfort.  Do some gentle stretching to your back and back exercises which are included in your packet. Do not operate heavy machinery or drink alcohol with taking narcotic pain medication or muscle relaxer.

## 2014-05-26 NOTE — ED Notes (Signed)
Pt verbalized understanding of d/c instructions and prescriptions and has no further questions. Pt in no acute distress upon d/c

## 2014-06-09 ENCOUNTER — Ambulatory Visit: Payer: Medicaid Other | Admitting: Internal Medicine

## 2014-07-05 ENCOUNTER — Emergency Department (HOSPITAL_COMMUNITY): Payer: Medicaid Other

## 2014-07-05 ENCOUNTER — Encounter (HOSPITAL_COMMUNITY): Payer: Self-pay | Admitting: Oncology

## 2014-07-05 ENCOUNTER — Emergency Department (HOSPITAL_COMMUNITY)
Admission: EM | Admit: 2014-07-05 | Discharge: 2014-07-05 | Disposition: A | Discharge status: Home / Self Care | Payer: Medicaid Other | Attending: Emergency Medicine | Admitting: Emergency Medicine

## 2014-07-05 DIAGNOSIS — W1830XA Fall on same level, unspecified, initial encounter: Secondary | ICD-10-CM | POA: Insufficient documentation

## 2014-07-05 DIAGNOSIS — S99912A Unspecified injury of left ankle, initial encounter: Secondary | ICD-10-CM | POA: Insufficient documentation

## 2014-07-05 DIAGNOSIS — Y9389 Activity, other specified: Secondary | ICD-10-CM | POA: Insufficient documentation

## 2014-07-05 DIAGNOSIS — Z8739 Personal history of other diseases of the musculoskeletal system and connective tissue: Secondary | ICD-10-CM | POA: Diagnosis not present

## 2014-07-05 DIAGNOSIS — Z8659 Personal history of other mental and behavioral disorders: Secondary | ICD-10-CM | POA: Diagnosis not present

## 2014-07-05 DIAGNOSIS — Z720 Tobacco use: Secondary | ICD-10-CM | POA: Insufficient documentation

## 2014-07-05 DIAGNOSIS — Y998 Other external cause status: Secondary | ICD-10-CM | POA: Diagnosis not present

## 2014-07-05 DIAGNOSIS — G40909 Epilepsy, unspecified, not intractable, without status epilepticus: Secondary | ICD-10-CM | POA: Insufficient documentation

## 2014-07-05 DIAGNOSIS — M25572 Pain in left ankle and joints of left foot: Secondary | ICD-10-CM

## 2014-07-05 DIAGNOSIS — Z79899 Other long term (current) drug therapy: Secondary | ICD-10-CM | POA: Diagnosis not present

## 2014-07-05 DIAGNOSIS — Z8719 Personal history of other diseases of the digestive system: Secondary | ICD-10-CM | POA: Insufficient documentation

## 2014-07-05 DIAGNOSIS — Y9289 Other specified places as the place of occurrence of the external cause: Secondary | ICD-10-CM | POA: Diagnosis not present

## 2014-07-05 MED ORDER — OXYCODONE-ACETAMINOPHEN 5-325 MG PO TABS
1.0000 | ORAL_TABLET | Freq: Once | ORAL | Status: AC
Start: 2014-07-05 — End: 2014-07-05
  Administered 2014-07-05: 1 via ORAL
  Filled 2014-07-05: qty 1

## 2014-07-05 MED ORDER — INDOMETHACIN 50 MG PO CAPS: 50.0000 mg | ORAL_CAPSULE | Freq: Three times a day (TID) | ORAL | Status: DC

## 2014-07-05 NOTE — Discharge Instructions (Signed)
Ankle Pain Ankle pain is a common symptom. The bones, cartilage, tendons, and muscles of the ankle joint perform a lot of work each day. The ankle joint holds your body weight and allows you to move around. Ankle pain can occur on either side or back of 1 or both ankles. Ankle pain may be sharp and burning or dull and aching. There may be tenderness, stiffness, redness, or warmth around the ankle. The pain occurs more often when a person walks or puts pressure on the ankle. CAUSES  There are many reasons ankle pain can develop. It is important to work with your caregiver to identify the cause since many conditions can impact the bones, cartilage, muscles, and tendons. Causes for ankle pain include:  Injury, including a break (fracture), sprain, or strain often due to a fall, sports, or a high-impact activity.  Swelling (inflammation) of a tendon (tendonitis).  Achilles tendon rupture.  Ankle instability after repeated sprains and strains.  Poor foot alignment.  Pressure on a nerve (tarsal tunnel syndrome).  Arthritis in the ankle or the lining of the ankle.  Crystal formation in the ankle (gout or pseudogout). DIAGNOSIS  A diagnosis is based on your medical history, your symptoms, results of your physical exam, and results of diagnostic tests. Diagnostic tests may include X-ray exams or a computerized magnetic scan (magnetic resonance imaging, MRI). TREATMENT  Treatment will depend on the cause of your ankle pain and may include:  Keeping pressure off the ankle and limiting activities.  Using crutches or other walking support (a cane or brace).  Using rest, ice, compression, and elevation.  Participating in physical therapy or home exercises.  Wearing shoe inserts or special shoes.  Losing weight.  Taking medications to reduce pain or swelling or receiving an injection.  Undergoing surgery. HOME CARE INSTRUCTIONS   Only take over-the-counter or prescription medicines for  pain, discomfort, or fever as directed by your caregiver.  Put ice on the injured area.  Put ice in a plastic bag.  Place a towel between your skin and the bag.  Leave the ice on for 15-20 minutes at a time, 03-04 times a day.  Keep your leg raised (elevated) when possible to lessen swelling.  Avoid activities that cause ankle pain.  Follow specific exercises as directed by your caregiver.  Record how often you have ankle pain, the location of the pain, and what it feels like. This information may be helpful to you and your caregiver.  Ask your caregiver about returning to work or sports and whether you should drive.  Follow up with your caregiver for further examination, therapy, or testing as directed. SEEK MEDICAL CARE IF:   Pain or swelling continues or worsens beyond 1 week.  You have an oral temperature above 102 F (38.9 C).  You are feeling unwell or have chills.  You are having an increasingly difficult time with walking.  You have loss of sensation or other new symptoms.  You have questions or concerns. MAKE SURE YOU:   Understand these instructions.  Will watch your condition.  Will get help right away if you are not doing well or get worse. Document Released: 09/15/2009 Document Revised: 06/20/2011 Document Reviewed: 09/15/2009 [facility] Patient Information 2015 [place], [place]. This information is not intended to replace advice given to you by your health care provider. Make sure you discuss any questions you have with your health care provider.  Arthralgia Arthralgia is joint pain. A joint is a place where two bones  meet. Joint pain can happen for many reasons. The joint can be bruised, stiff, infected, or weak from aging. Pain usually goes away after resting and taking medicine for soreness.  HOME CARE  Rest the joint as told by your doctor.  Keep the sore joint raised (elevated) for the first 24 hours.  Put ice on the joint area.  Put ice in a  plastic bag.  Place a towel between your skin and the bag.  Leave the ice on for 15-20 minutes, 03-04 times a day.  Wear your splint, casting, elastic bandage, or sling as told by your doctor.  Only take medicine as told by your doctor. Do not take aspirin.  Use crutches as told by your doctor. Do not put weight on the joint until told to by your doctor. GET HELP RIGHT AWAY IF:   You have bruising, puffiness (swelling), or more pain.  Your fingers or toes turn blue or start to lose feeling (numb).  Your medicine does not lessen the pain.  Your pain becomes severe.  You have a temperature by mouth above 102 F (38.9 C), not controlled by medicine.  You cannot move or use the joint. MAKE SURE YOU:   Understand these instructions.  Will watch your condition.  Will get help right away if you are not doing well or get worse. Document Released: 03/16/2009 Document Revised: 06/20/2011 Document Reviewed: 03/16/2009 [facility] Patient Information 2015 [place]. This information is not intended to replace advice given to you by your health care provider. Make sure you discuss any questions you have with your health care provider. Your x-ray is normal

## 2014-07-05 NOTE — ED Notes (Signed)
Per pt his left ankle began hurting yesterday.  Today when pt woke up he fell over as it hurt to much to put pressure on ankle.  Ankle is red, swollen, limited ROM and painful.  Pt rates 10/10, burning and throbbing in nature.  Pt can put pressure on ankle now however it does cause significant pain.

## 2014-07-05 NOTE — ED Provider Notes (Signed)
CSN: [id number]     Arrival date & time 07/05/14  1910 History  This chart was scribed for Christopher Favor, NP, working with Christopher Razor, MD by Christopher Gilbert, ED Scribe. The patient was seen in room WTR5/WTR5 at 8:11 PM.    Chief Complaint  Patient presents with  . Ankle Pain      The history is provided by the patient. No language interpreter was used.    HPI Comments: Christopher Gilbert is a 29 y.o. male with a medical hx of arthritis who presents to the Emergency Department complaining of worsening left ankle pain onset yesterday. When the pt woke up today he fell over because he was unable to bear weight due to the pain. Pt can wiggle his toes. He states that he is having associated symptoms of redness, joint swelling. He states that he has tried 2 200 mg ibuprofen with no relief for his symptoms. His last dose was at 2 PM Today and he took 800 mg. He denies  any other symptoms. Pt is a Public house manager. Pt was exercising but he has slowed down because of his back. Pt had L5-S1 fusion in December 2014.    Past Medical History  Diagnosis Date  . Attention deficit hyperactivity disorder   . GERD (gastroesophageal reflux disease)   . Headache(784.0)     migraines after seizure  . Seizures     grand mal, haven't had a seizure in "a long time"  . Epilepsy   . Arthritis     gout in right big toe   Past Surgical History  Procedure Laterality Date  . Colonoscopy    . Back surgery     Family History  Problem Relation Age of Onset  . Diabetes Paternal Grandmother    History  Substance Use Topics  . Smoking status: Current Every Day Smoker    Types: Cigarettes  . Smokeless tobacco: Never Used  . Alcohol Use: Yes     Comment: occasional/socially    Review of Systems  Musculoskeletal: Positive for joint swelling and arthralgias.  Skin:       Redness to the left ankle  All other systems reviewed and are negative.     Allergies  Review of patient's allergies indicates  no known allergies.  Home Medications   Prior to Admission medications   Medication Sig Start Date End Date Taking? Authorizing Provider  divalproex (DEPAKOTE) 250 MG DR tablet Take 1 tablet (250 mg total) by mouth 2 (two) times daily. 02/11/14  Yes Christopher Angst, MD  divalproex (DEPAKOTE) 500 MG DR tablet Take 1 tablet (500 mg total) by mouth 2 (two) times daily. 02/11/14  Yes Christopher Angst, MD  ibuprofen (ADVIL,MOTRIN) 100 MG tablet Take 200-800 mg by mouth every 6 (six) hours as needed for pain.   Yes Historical Provider, MD  topiramate (TOPAMAX) 100 MG tablet Take 1 tablet (100 mg total) by mouth 2 (two) times daily. 02/11/14  Yes Christopher Angst, MD  cyclobenzaprine (FLEXERIL) 10 MG tablet Take 1 tablet (10 mg total) by mouth 2 (two) times daily as needed for muscle spasms. 05/25/14   Christopher Drown, PA-C  HYDROcodone-acetaminophen (NORCO/VICODIN) 5-325 MG per tablet Take 1 tablet by mouth every 6 (six) hours as needed for moderate pain or severe pain. 05/25/14   Christopher Drown, PA-C  ibuprofen (ADVIL,MOTRIN) 800 MG tablet Take 1 tablet (800 mg total) by mouth 3 (three) times daily. 05/25/14   Christopher Drown, PA-C  indomethacin (INDOCIN)  50 MG capsule Take 1 capsule (50 mg total) by mouth 3 (three) times daily with meals. 07/05/14   Christopher FavorGail Galaxy Borden, NP   BP 175/74 mmHg  Pulse 107  Temp(Src) 97.4 F (36.3 C) (Oral)  Resp 20  SpO2 97%  Physical Exam  Constitutional: He is oriented to person, place, and time. He appears well-developed and well-nourished. No distress.  HENT:  Head: Normocephalic and atraumatic.  Eyes: EOM are normal.  Neck: Neck supple. No tracheal deviation present.  Cardiovascular: Normal rate.   Pulmonary/Chest: Effort normal. No respiratory distress.  Musculoskeletal: Normal range of motion.       Left ankle: Tenderness. Lateral malleolus tenderness found.  Swell over lateral malleolus. No discoloration or break in skin.  Neurological: He is alert and oriented  to person, place, and time.  Skin: Skin is warm and dry.  Psychiatric: He has a normal mood and affect. His behavior is normal.  Nursing note and vitals reviewed.   ED Course  Procedures (including critical care time) DIAGNOSTIC STUDIES: Oxygen Saturation is 97% on RA, normal by my interpretation.    COORDINATION OF CARE: 8:15 PM-Discussed treatment plan which includes left ankle X-ray and percocet with pt at bedside and pt agreed to plan.   Labs Review Labs Reviewed - No data to display  Imaging Review Dg Ankle Complete Left  07/05/2014   CLINICAL DATA:  29 year old male with swelling, redness and pain in the left ankle since yesterday. No history of injury.  EXAM: LEFT ANKLE COMPLETE - 3+ VIEW  COMPARISON:  No priors.  FINDINGS: Multiple views of the left ankle demonstrate no acute displaced fracture, subluxation, dislocation, or soft tissue abnormality.  IMPRESSION: No acute radiographic abnormality of the left ankle.   Electronically Signed   By: Christopher Reedaniel  Gilbert M.D.   On: 07/05/2014 20:47     EKG Interpretation None     shin is normal.  X-ray has no history to explain the discomfort in his ankle.  He has been placed in an ASO and given Indocin to take on a regular basis  MDM   Final diagnoses:  Ankle pain, left      Christopher FavorGail Aarica Wax, NP 07/05/14 [id number]  Christopher RazorStephen Kohut, MD 07/05/14 2312

## 2014-08-14 ENCOUNTER — Encounter: Payer: Self-pay | Admitting: Internal Medicine

## 2014-08-14 ENCOUNTER — Ambulatory Visit: Payer: Medicaid Other | Attending: Internal Medicine | Admitting: Internal Medicine

## 2014-08-14 VITALS — BP 125/83 | HR 68 | Temp 98.8°F | Resp 16 | Wt 247.6 lb

## 2014-08-14 DIAGNOSIS — G40909 Epilepsy, unspecified, not intractable, without status epilepticus: Secondary | ICD-10-CM | POA: Insufficient documentation

## 2014-08-14 DIAGNOSIS — M10071 Idiopathic gout, right ankle and foot: Secondary | ICD-10-CM | POA: Diagnosis present

## 2014-08-14 DIAGNOSIS — F909 Attention-deficit hyperactivity disorder, unspecified type: Secondary | ICD-10-CM | POA: Diagnosis not present

## 2014-08-14 DIAGNOSIS — Z791 Long term (current) use of non-steroidal anti-inflammatories (NSAID): Secondary | ICD-10-CM | POA: Insufficient documentation

## 2014-08-14 DIAGNOSIS — M1009 Idiopathic gout, multiple sites: Secondary | ICD-10-CM | POA: Diagnosis not present

## 2014-08-14 DIAGNOSIS — M109 Gout, unspecified: Secondary | ICD-10-CM | POA: Insufficient documentation

## 2014-08-14 MED ORDER — ALLOPURINOL 100 MG PO TABS: 100.0000 mg | ORAL_TABLET | Freq: Every day | ORAL | Status: DC

## 2014-08-14 NOTE — Progress Notes (Signed)
Patient here for follow up and physical Patient was recently seen in the ED for left ankle pain Patient tends to think it was a gout flair up but does not have Medication for gout He also states he has been having flair ups to both of his great toes

## 2014-08-14 NOTE — Patient Instructions (Signed)
Epilepsy Epilepsy is a disorder in which a person has repeated seizures over time. A seizure is a release of abnormal electrical activity in the brain. Seizures can cause a change in attention, behavior, or the ability to remain awake and alert (altered mental status). Seizures often involve uncontrollable shaking (convulsions).  Most people with epilepsy lead normal lives. However, people with epilepsy are at an increased risk of falls, accidents, and injuries. Therefore, it is important to begin treatment right away. CAUSES  Epilepsy has many possible causes. Anything that disturbs the normal pattern of brain cell activity can lead to seizures. This may include:   Head injury.  Birth trauma.  High fever as a child.  Stroke.  Bleeding into or around the brain.  Certain drugs.  Prolonged low oxygen, such as what occurs after CPR efforts.  Abnormal brain development.  Certain illnesses, such as meningitis, encephalitis (brain infection), malaria, and other infections.  An imbalance of nerve signaling chemicals (neurotransmitters).  SIGNS AND SYMPTOMS  The symptoms of a seizure can vary greatly from one person to another. Right before a seizure, you may have a warning (aura) that a seizure is about to occur. An aura may include the following symptoms:  Fear or anxiety.  Nausea.  Feeling like the room is spinning (vertigo).  Vision changes, such as seeing flashing lights or spots. Common symptoms during a seizure include:  Abnormal sensations, such as an abnormal smell or a bitter taste in the mouth.   Sudden, general body stiffness.   Convulsions that involve rhythmic jerking of the face, arm, or leg on one or both sides.   Sudden change in consciousness.   Appearing to be awake but not responding.   Appearing to be asleep but cannot be awakened.   Grimacing, chewing, lip smacking, drooling, tongue biting, or loss of bowel or bladder control. After a seizure,  you may feel sleepy for a while. DIAGNOSIS  Your health care provider will ask about your symptoms and take a medical history. Descriptions from any witnesses to your seizures will be very helpful in the diagnosis. A physical exam, including a detailed neurological exam, is necessary. Various tests may be done, such as:   An electroencephalogram (EEG). This is a painless test of your brain waves. In this test, a diagram is created of your brain waves. These diagrams can be interpreted by a specialist.  An MRI of the brain.   A CT scan of the brain.   A spinal tap (lumbar puncture, LP).  Blood tests to check for signs of infection or abnormal blood chemistry. TREATMENT  There is no cure for epilepsy, but it is generally treatable. Once epilepsy is diagnosed, it is important to begin treatment as soon as possible. For most people with epilepsy, seizures can be controlled with medicines. The following may also be used:  A pacemaker for the brain (vagus nerve stimulator) can be used for people with seizures that are not well controlled by medicine.  Surgery on the brain. For some people, epilepsy eventually goes away. HOME CARE INSTRUCTIONS   Follow your health care provider's recommendations on driving and safety in normal activities.  Get enough rest. Lack of sleep can cause seizures.  Only take over-the-counter or prescription medicines as directed by your health care provider. Take any prescribed medicine exactly as directed.  Avoid any known triggers of your seizures.  Keep a seizure diary. Record what you recall about any seizure, especially any possible trigger.   Make   sure the people you live and work with know that you are prone to seizures. They should receive instructions on how to help you. In general, a witness to a seizure should:   Cushion your head and body.   Turn you on your side.   Avoid unnecessarily restraining you.   Not place anything inside your  mouth.   Call for emergency medical help if there is any question about what has occurred.   Follow up with your health care provider as directed. You may need regular blood tests to monitor the levels of your medicine.  SEEK MEDICAL CARE IF:   You develop signs of infection or other illness. This might increase the risk of a seizure.   You seem to be having more frequent seizures.   Your seizure pattern is changing.  SEEK IMMEDIATE MEDICAL CARE IF:   You have a seizure that does not stop after a few moments.   You have a seizure that causes any difficulty in breathing.   You have a seizure that results in a very severe headache.   You have a seizure that leaves you with the inability to speak or use a part of your body.  Document Released: 03/28/2005 Document Revised: 01/16/2013 Document Reviewed: 11/07/2012 [facility] Patient Information 2015 [place], [place]. This information is not intended to replace advice given to you by your health care provider. Make sure you discuss any questions you have with your health care provider. Gout Gout is an inflammatory arthritis caused by a buildup of uric acid crystals in the joints. Uric acid is a chemical that is normally present in the blood. When the level of uric acid in the blood is too high it can form crystals that deposit in your joints and tissues. This causes joint redness, soreness, and swelling (inflammation). Repeat attacks are common. Over time, uric acid crystals can form into masses (tophi) near a joint, destroying bone and causing disfigurement. Gout is treatable and often preventable. CAUSES  The disease begins with elevated levels of uric acid in the blood. Uric acid is produced by your body when it breaks down a naturally found substance called purines. Certain foods you eat, such as meats and fish, contain high amounts of purines. Causes of an elevated uric acid level include:  Being passed down from parent to child  (heredity).  Diseases that cause increased uric acid production (such as obesity, psoriasis, and certain cancers).  Excessive alcohol use.  Diet, especially diets rich in meat and seafood.  Medicines, including certain cancer-fighting medicines (chemotherapy), water pills (diuretics), and aspirin.  Chronic kidney disease. The kidneys are no longer able to remove uric acid well.  Problems with metabolism. Conditions strongly associated with gout include:  Obesity.  High blood pressure.  High cholesterol.  Diabetes. Not everyone with elevated uric acid levels gets gout. It is not understood why some people get gout and others do not. Surgery, joint injury, and eating too much of certain foods are some of the factors that can lead to gout attacks. SYMPTOMS   An attack of gout comes on quickly. It causes intense pain with redness, swelling, and warmth in a joint.  Fever can occur.  Often, only one joint is involved. Certain joints are more commonly involved:  Base of the big toe.  Knee.  Ankle.  Wrist.  Finger. Without treatment, an attack usually goes away in a few days to weeks. Between attacks, you usually will not have symptoms, which is different from  many other forms of arthritis. DIAGNOSIS  Your caregiver will suspect gout based on your symptoms and exam. In some cases, tests may be recommended. The tests may include:  Blood tests.  Urine tests.  X-rays.  Joint fluid exam. This exam requires a needle to remove fluid from the joint (arthrocentesis). Using a microscope, gout is confirmed when uric acid crystals are seen in the joint fluid. TREATMENT  There are two phases to gout treatment: treating the sudden onset (acute) attack and preventing attacks (prophylaxis).  Treatment of an Acute Attack.  Medicines are used. These include anti-inflammatory medicines or steroid medicines.  An injection of steroid medicine into the affected joint is sometimes  necessary.  The painful joint is rested. Movement can worsen the arthritis.  You may use warm or cold treatments on painful joints, depending which works best for you.  Treatment to Prevent Attacks.  If you suffer from frequent gout attacks, your caregiver may advise preventive medicine. These medicines are started after the acute attack subsides. These medicines either help your kidneys eliminate uric acid from your body or decrease your uric acid production. You may need to stay on these medicines for a very long time.  The early phase of treatment with preventive medicine can be associated with an increase in acute gout attacks. For this reason, during the first few months of treatment, your caregiver may also advise you to take medicines usually used for acute gout treatment. Be sure you understand your caregiver's directions. Your caregiver may make several adjustments to your medicine dose before these medicines are effective.  Discuss dietary treatment with your caregiver or dietitian. Alcohol and drinks high in sugar and fructose and foods such as meat, poultry, and seafood can increase uric acid levels. Your caregiver or dietitian can advise you on drinks and foods that should be limited. HOME CARE INSTRUCTIONS   Do not take aspirin to relieve pain. This raises uric acid levels.  Only take over-the-counter or prescription medicines for pain, discomfort, or fever as directed by your caregiver.  Rest the joint as much as possible. When in bed, keep sheets and blankets off painful areas.  Keep the affected joint raised (elevated).  Apply warm or cold treatments to painful joints. Use of warm or cold treatments depends on which works best for you.  Use crutches if the painful joint is in your leg.  Drink enough fluids to keep your urine clear or pale yellow. This helps your body get rid of uric acid. Limit alcohol, sugary drinks, and fructose drinks.  Follow your dietary  instructions. Pay careful attention to the amount of protein you eat. Your daily diet should emphasize fruits, vegetables, whole grains, and fat-free or low-fat milk products. Discuss the use of coffee, vitamin C, and cherries with your caregiver or dietitian. These may be helpful in lowering uric acid levels.  Maintain a healthy body weight. SEEK MEDICAL CARE IF:   You develop diarrhea, vomiting, or any side effects from medicines.  You do not feel better in 24 hours, or you are getting worse. SEEK IMMEDIATE MEDICAL CARE IF:   Your joint becomes suddenly more tender, and you have chills or a fever. MAKE SURE YOU:   Understand these instructions.  Will watch your condition.  Will get help right away if you are not doing well or get worse. Document Released: 03/25/2000 Document Revised: 08/12/2013 Document Reviewed: 11/09/2011 [facility]ExitCare Patient Information 2015 [place]ExitCare, [place]LLC. This information is not intended to replace advice given to  you by your health care provider. Make sure you discuss any questions you have with your health care provider.  

## 2014-08-14 NOTE — Progress Notes (Signed)
Patient ID: Christopher Gilbert, male   DOB: 01/25/1986, 29 y.o.   MRN: [id number]   Christopher Gilbert, is a 29 y.o. male  [id number]  [id number]  DOB - 10/22/1985  Chief Complaint  Patient presents with  . Follow-up        Subjective:   Christopher Gilbert is a 29 y.o. male here today for a follow up visit. Patient has history of seizure disorders, attention deficit hyperactive disorder, GERD and recurrent ankle and great toe swelling and pain, elevated uric acid. He was seen in the ED recently for symptoms likely from gout flareup, patient was treated with Indocin, swelling and pain has subsided. Patient is here today for follow-up ED visit. Patient has no new complaints today. He has not had a seizure for over several months. He is compliant with his medications with no side effects reported. Patient would like to start prophylaxis treatment of gout. Patient has No headache, No chest pain, no abdominal pain - No Nausea, No new weakness tingling or numbness, No Cough - SOB.  No problems updated.  ALLERGIES: No Known Allergies  PAST MEDICAL HISTORY: Past Medical History  Diagnosis Date  . Attention deficit hyperactivity disorder   . GERD (gastroesophageal reflux disease)   . Headache(784.0)     migraines after seizure  . Seizures     grand mal, haven't had a seizure in "a long time"  . Epilepsy   . Arthritis     gout in right big toe  . Gout     MEDICATIONS AT HOME: Prior to Admission medications   Medication Sig Start Date End Date Taking? Authorizing Provider  allopurinol (ZYLOPRIM) 100 MG tablet Take 1 tablet (100 mg total) by mouth daily. 08/14/14   Quentin Angstlugbemiga E Weda Baumgarner, MD  cyclobenzaprine (FLEXERIL) 10 MG tablet Take 1 tablet (10 mg total) by mouth 2 (two) times daily as needed for muscle spasms. 05/25/14   Mellody DrownLauren Parker, PA-C  divalproex (DEPAKOTE) 250 MG DR tablet Take 1 tablet (250 mg total) by mouth 2 (two) times daily. 02/11/14   Quentin Angstlugbemiga E Nehemiah Mcfarren, MD  divalproex  (DEPAKOTE) 500 MG DR tablet Take 1 tablet (500 mg total) by mouth 2 (two) times daily. 02/11/14   Quentin Angstlugbemiga E Stefon Ramthun, MD  HYDROcodone-acetaminophen (NORCO/VICODIN) 5-325 MG per tablet Take 1 tablet by mouth every 6 (six) hours as needed for moderate pain or severe pain. 05/25/14   Mellody DrownLauren Parker, PA-C  ibuprofen (ADVIL,MOTRIN) 100 MG tablet Take 200-800 mg by mouth every 6 (six) hours as needed for pain.    Historical Provider, MD  ibuprofen (ADVIL,MOTRIN) 800 MG tablet Take 1 tablet (800 mg total) by mouth 3 (three) times daily. 05/25/14   Mellody DrownLauren Parker, PA-C  indomethacin (INDOCIN) 50 MG capsule Take 1 capsule (50 mg total) by mouth 3 (three) times daily with meals. 07/05/14   Earley FavorGail Schulz, NP  topiramate (TOPAMAX) 100 MG tablet Take 1 tablet (100 mg total) by mouth 2 (two) times daily. 02/11/14   Quentin Angstlugbemiga E Aadit Hagood, MD     Objective:   Filed Vitals:   08/14/14 1022  BP: 125/83  Pulse: 68  Temp: 98.8 F (37.1 C)  Resp: 16  Weight: 247 lb 9.6 oz (112.311 kg)  SpO2: 100%    Exam General appearance : Awake, alert, not in any distress. Speech Clear. Not toxic looking HEENT: Atraumatic and Normocephalic, pupils equally reactive to light and accomodation Neck: supple, no JVD. No cervical lymphadenopathy.  Chest:Good air entry bilaterally, no added sounds  CVS:  S1 S2 regular, no murmurs.  Abdomen: Bowel sounds present, Non tender and not distended with no gaurding, rigidity or rebound. Extremities: B/L Lower Ext shows no edema, both legs are warm to touch Neurology: Awake alert, and oriented X 3, CN II-XII intact, Non focal Skin:No Rash  Data Review No results found for: HGBA1C   Assessment & Plan   Gouty arthritis We will start patient on low-dose allopurinol 100 mg tablet by mouth daily Patient has been counseled extensively to avoid alcohol use  - allopurinol (ZYLOPRIM) 100 MG tablet; Take 1 tablet (100 mg total) by mouth daily.  Dispense: 30 tablet; Refill: 6  Patient have been  counseled extensively about nutrition and exercise Return in about 6 months (around 02/14/2015), or if symptoms worsen or fail to improve, for Seizure Disorder, Gouty Arthritis.  The patient was given clear instructions to go to ER or return to medical center if symptoms don't improve, worsen or new problems develop. The patient verbalized understanding. The patient was told to call to get lab results if they haven't heard anything in the next week.   This note has been created with Education officer, environmentalDragon speech recognition software and smart phrase technology. Any transcriptional errors are unintentional.    Jeanann LewandowskyJEGEDE, Doreene Forrey, MD, MHA, CPE, FACP, FAAP [facility] [place], [place] [phone]   08/14/2014, 11:15 AM

## 2015-02-19 ENCOUNTER — Other Ambulatory Visit: Payer: Self-pay | Admitting: Internal Medicine

## 2015-02-19 NOTE — Telephone Encounter (Signed)
Nurse called patient, reached voicemail. Left message for patient to call Christopher Gilbert with [facility], at [phone]. Nurse called patient to make him aware of need for appointment to be seen.  Nurse sent refill request for Topamax to provider for approval.

## 2015-02-19 NOTE — Telephone Encounter (Signed)
Patient returned phone call, please f/u °

## 2015-02-20 ENCOUNTER — Other Ambulatory Visit: Payer: Self-pay | Admitting: Internal Medicine

## 2015-02-25 ENCOUNTER — Other Ambulatory Visit: Payer: Self-pay

## 2015-02-25 DIAGNOSIS — G40409 Other generalized epilepsy and epileptic syndromes, not intractable, without status epilepticus: Secondary | ICD-10-CM

## 2015-02-25 DIAGNOSIS — G40309 Generalized idiopathic epilepsy and epileptic syndromes, not intractable, without status epilepticus: Secondary | ICD-10-CM

## 2015-02-26 ENCOUNTER — Ambulatory Visit: Payer: PRIVATE HEALTH INSURANCE | Attending: Family Medicine | Admitting: Family Medicine

## 2015-02-26 ENCOUNTER — Encounter: Payer: Self-pay | Admitting: Family Medicine

## 2015-02-26 VITALS — BP 138/83 | HR 105 | Temp 97.9°F | Resp 16 | Ht 72.0 in | Wt 264.6 lb

## 2015-02-26 DIAGNOSIS — M1009 Idiopathic gout, multiple sites: Secondary | ICD-10-CM

## 2015-02-26 DIAGNOSIS — G40909 Epilepsy, unspecified, not intractable, without status epilepticus: Secondary | ICD-10-CM | POA: Insufficient documentation

## 2015-02-26 DIAGNOSIS — G40309 Generalized idiopathic epilepsy and epileptic syndromes, not intractable, without status epilepticus: Secondary | ICD-10-CM

## 2015-02-26 DIAGNOSIS — M109 Gout, unspecified: Secondary | ICD-10-CM | POA: Insufficient documentation

## 2015-02-26 DIAGNOSIS — G40409 Other generalized epilepsy and epileptic syndromes, not intractable, without status epilepticus: Secondary | ICD-10-CM

## 2015-02-26 DIAGNOSIS — M62830 Muscle spasm of back: Secondary | ICD-10-CM | POA: Diagnosis not present

## 2015-02-26 DIAGNOSIS — M1000 Idiopathic gout, unspecified site: Secondary | ICD-10-CM | POA: Diagnosis not present

## 2015-02-26 MED ORDER — CYCLOBENZAPRINE HCL 10 MG PO TABS: 10.0000 mg | ORAL_TABLET | Freq: Two times a day (BID) | ORAL | Status: DC | PRN

## 2015-02-26 MED ORDER — DIVALPROEX SODIUM 500 MG PO DR TAB: 500.0000 mg | DELAYED_RELEASE_TABLET | Freq: Two times a day (BID) | ORAL | Status: DC

## 2015-02-26 MED ORDER — DIVALPROEX SODIUM 250 MG PO DR TAB: 250.0000 mg | DELAYED_RELEASE_TABLET | Freq: Two times a day (BID) | ORAL | Status: DC

## 2015-02-26 MED ORDER — DIVALPROEX SODIUM 500 MG PO DR TAB: DELAYED_RELEASE_TABLET | ORAL | Status: DC

## 2015-02-26 MED ORDER — TOPIRAMATE 100 MG PO TABS: 100.0000 mg | ORAL_TABLET | Freq: Two times a day (BID) | ORAL | Status: DC

## 2015-02-26 MED ORDER — DIVALPROEX SODIUM 250 MG PO DR TAB: DELAYED_RELEASE_TABLET | ORAL | Status: DC

## 2015-02-26 MED ORDER — ALLOPURINOL 100 MG PO TABS: 100.0000 mg | ORAL_TABLET | Freq: Every day | ORAL | Status: DC

## 2015-02-26 NOTE — Patient Instructions (Signed)
It was a pleasure to see you today.   We are checking your uric acid level today.   Please follow up in the coming six months or sooner as needed

## 2015-02-26 NOTE — Progress Notes (Signed)
   Subjective:    Patient ID: Marlowe AltMatthew Vincent Myung, male    DOB: 09/16/1985, 29 y.o.   MRN: [id number]  HPI Presents today for medication refills.  He had his last gouty flare 3-4 months ago.  Has not had urate level checked.  Is taking allopurinol 100mg  daily for this.   Epilepsy since childhood. Last seizure 3-4 years ago. Stable on current dose of depakote 750mg  twice daily. No sedation from med.   Had back surgery December 2015, occasional spasms in back. Takes cyclobenzaprine periodically when spasms are bad, would like to have on hand for this eventuality  Social Hx; In process of [place], scheduled to move in just before Christmas.   Review of Systems Denies sedation, fevers or chills. No joint pain or redness.     Objective:   Physical Exam Well appearing, no distress.  HEENT neck supple, no cervical adenopathy PERRL. EOMI. Pupils midsized and symmetric.         Assessment & Plan:

## 2015-02-26 NOTE — Progress Notes (Signed)
Patient here for medication refills. Patient denies any pain today. Patient needs a refill on all his medication- allopurinol, flexeril, depakote, and topamax.

## 2015-02-26 NOTE — Telephone Encounter (Signed)
Nurse called home number, reached voicemail, left message for patient to return call to [facility] at [phone]. Nurse called mobile number, reached patients wife. Left message for patient to return call to [facility] at [phone]. Nurse called patient to make him aware of refill Depakote and Topamax for 1 month supply only.  Patient has appointment to be seen today.

## 2015-02-27 LAB — BASIC METABOLIC PANEL
BUN: 13 mg/dL (ref 7–25)
CO2: 24 mmol/L (ref 20–31)
CREATININE: 0.99 mg/dL (ref 0.60–1.35)
Calcium: 9.1 mg/dL (ref 8.6–10.3)
Chloride: 107 mmol/L (ref 98–110)
GLUCOSE: 102 mg/dL — AB (ref 65–99)
Potassium: 4.2 mmol/L (ref 3.5–5.3)
SODIUM: 141 mmol/L (ref 135–146)

## 2015-02-27 LAB — URIC ACID: Uric Acid, Serum: 8 mg/dL — ABNORMAL HIGH (ref 4.0–7.8)

## 2015-02-27 MED ORDER — ALLOPURINOL 100 MG PO TABS: 200.0000 mg | ORAL_TABLET | Freq: Every day | ORAL | Status: DC

## 2015-02-27 NOTE — Addendum Note (Signed)
Addended by: Barbaraann BarthelBREEN, Eyva Califano O on: 02/27/2015 08:30 AM   Modules accepted: Orders

## 2015-03-03 ENCOUNTER — Telehealth: Payer: Self-pay

## 2015-03-03 NOTE — Telephone Encounter (Signed)
-----   Message from Barbaraann BarthelJames O Breen, MD sent at 02/27/2015  8:28 AM EST ----- Please let patient know his kidney function is normal, and the uric acid level is 8-- above the target goal of 6 for people with gout.  I ask him to increase his allopurinol from 100mg /day to 200/day and return for office visit in about 1 month for assessment and repeat uric acid measurement. I will create addendum in yesterday's ov and re-issue prescription for 200/day.  Thanks,  JB

## 2015-03-03 NOTE — Telephone Encounter (Signed)
Nurse called patient at home number, reached voicemail. Left message for patient to call Lizbett Garciagarcia with [facility], at [phone]. Nurse called patient at mobile number, reached wife, left message for patient to call Herbert SetaHeather with [facility], at [phone].

## 2015-03-10 NOTE — Telephone Encounter (Signed)
Nurse called patient at home number, reached voicemail. Left message for patient to call Casidy Alberta with [facility], at [phone]. Nurse called mobile number, reached wife at work. Per wife, call patient tomorrow afternoon. Patient is off work Advertising account executivetomorrow. Nurse will attempt to call patient tomorrow afternoon.

## 2015-03-10 NOTE — Telephone Encounter (Signed)
-----   Message from James O Breen, MD sent at 02/27/2015  8:28 AM EST ----- Please let patient know his kidney function is normal, and the uric acid level is 8-- above the target goal of 6 for people with gout.  I ask him to increase his allopurinol from 100mg/day to 200/day and return for office visit in about 1 month for assessment and repeat uric acid measurement. I will create addendum in yesterday's ov and re-issue prescription for 200/day.  Thanks,  JB 

## 2015-03-11 NOTE — Telephone Encounter (Signed)
Nurse called patient, reached voicemail. Left message for patient to call Oseias Horsey with [facility], at [phone]. Nurse called patient to make patient aware of normal kidney function. Uric acid level is 8, above the target goal of 6 for people with gout. Patient needs to increase allopurinol from 100mg /day to 200mg /day and come back to see Dr. Mauricio Pobreen an 1 month for assessment and repeat uric acid measurement.  Prescription sent to pharmacy for allopurinol 200mg /day.

## 2015-03-11 NOTE — Telephone Encounter (Signed)
-----   Message from James O Breen, MD sent at 02/27/2015  8:28 AM EST ----- Please let patient know his kidney function is normal, and the uric acid level is 8-- above the target goal of 6 for people with gout.  I ask him to increase his allopurinol from 100mg/day to 200/day and return for office visit in about 1 month for assessment and repeat uric acid measurement. I will create addendum in yesterday's ov and re-issue prescription for 200/day.  Thanks,  JB 

## 2015-03-17 NOTE — Telephone Encounter (Signed)
Nurse called patient, reached voicemail. Left message for patient to call Jasenia Weilbacher with [facility], at [phone]. Please see previous note. Nurse will send patient a letter to ask patient to call [facility].

## 2015-03-23 ENCOUNTER — Encounter (HOSPITAL_COMMUNITY): Payer: Self-pay

## 2015-03-23 ENCOUNTER — Emergency Department (HOSPITAL_COMMUNITY): Payer: PRIVATE HEALTH INSURANCE

## 2015-03-23 ENCOUNTER — Other Ambulatory Visit: Payer: Self-pay | Admitting: *Deleted

## 2015-03-23 ENCOUNTER — Emergency Department (HOSPITAL_COMMUNITY)
Admission: EM | Admit: 2015-03-23 | Discharge: 2015-03-23 | Disposition: A | Discharge status: Home / Self Care | Payer: PRIVATE HEALTH INSURANCE | Attending: Emergency Medicine | Admitting: Emergency Medicine

## 2015-03-23 DIAGNOSIS — Z8659 Personal history of other mental and behavioral disorders: Secondary | ICD-10-CM | POA: Diagnosis not present

## 2015-03-23 DIAGNOSIS — F17210 Nicotine dependence, cigarettes, uncomplicated: Secondary | ICD-10-CM | POA: Diagnosis not present

## 2015-03-23 DIAGNOSIS — M109 Gout, unspecified: Secondary | ICD-10-CM | POA: Diagnosis not present

## 2015-03-23 DIAGNOSIS — Z79899 Other long term (current) drug therapy: Secondary | ICD-10-CM | POA: Diagnosis not present

## 2015-03-23 DIAGNOSIS — M545 Low back pain, unspecified: Secondary | ICD-10-CM

## 2015-03-23 DIAGNOSIS — M1990 Unspecified osteoarthritis, unspecified site: Secondary | ICD-10-CM | POA: Diagnosis not present

## 2015-03-23 DIAGNOSIS — G40309 Generalized idiopathic epilepsy and epileptic syndromes, not intractable, without status epilepticus: Secondary | ICD-10-CM

## 2015-03-23 DIAGNOSIS — Z8719 Personal history of other diseases of the digestive system: Secondary | ICD-10-CM | POA: Diagnosis not present

## 2015-03-23 DIAGNOSIS — G40409 Other generalized epilepsy and epileptic syndromes, not intractable, without status epilepticus: Secondary | ICD-10-CM

## 2015-03-23 DIAGNOSIS — G40909 Epilepsy, unspecified, not intractable, without status epilepticus: Secondary | ICD-10-CM | POA: Insufficient documentation

## 2015-03-23 MED ORDER — HYDROCODONE-ACETAMINOPHEN 5-325 MG PO TABS: 1.0000 | ORAL_TABLET | ORAL | Status: DC | PRN

## 2015-03-23 MED ORDER — DIVALPROEX SODIUM 250 MG PO DR TAB: DELAYED_RELEASE_TABLET | ORAL | Status: DC

## 2015-03-23 MED ORDER — HYDROMORPHONE HCL 1 MG/ML IJ SOLN
1.0000 mg | Freq: Once | INTRAMUSCULAR | Status: AC
  Administered 2015-03-23: 1 mg via INTRAMUSCULAR
  Filled 2015-03-23: qty 1

## 2015-03-23 MED ORDER — TOPIRAMATE 100 MG PO TABS: 100.0000 mg | ORAL_TABLET | Freq: Two times a day (BID) | ORAL | Status: DC

## 2015-03-23 MED ORDER — KETOROLAC TROMETHAMINE 60 MG/2ML IM SOLN
60.0000 mg | Freq: Once | INTRAMUSCULAR | Status: AC
  Administered 2015-03-23: 60 mg via INTRAMUSCULAR
  Filled 2015-03-23: qty 2

## 2015-03-23 MED ORDER — IBUPROFEN 800 MG PO TABS: 800.0000 mg | ORAL_TABLET | Freq: Three times a day (TID) | ORAL | Status: DC

## 2015-03-23 MED ORDER — ORPHENADRINE CITRATE ER 100 MG PO TB12: 100.0000 mg | ORAL_TABLET | Freq: Two times a day (BID) | ORAL | Status: DC

## 2015-03-23 NOTE — ED Notes (Signed)
Returned from MRI 

## 2015-03-23 NOTE — ED Notes (Signed)
Pt transported to MRI 

## 2015-03-23 NOTE — ED Notes (Signed)
Patient not in room currently in MRI.  

## 2015-03-23 NOTE — ED Provider Notes (Signed)
CSN: [id number]     Arrival date & time 03/23/15  4098 History   First MD Initiated Contact with Patient 03/23/15 [phone]     Chief Complaint  Patient presents with  . Back Pain     (Consider location/radiation/quality/duration/timing/severity/associated sxs/prior Treatment) HPI Patient has been having occasional back pains for the past 2 weeks. He reports he's been having intermittent episodes of bowel incontinence. He states this happened prior to his last surgery when he needed an L5-S1 fusion. He reports that he bent over this morning and tried to stand. Upon trying to stand his back felt like a pole when into the center of it and he was unable to straighten up again. The ports has severe pain he indicates approximately the L4 region. He reports this is radiating up his back. He denies numbness or tingling into his legs. He denies associated abdominal pain Past Medical History  Diagnosis Date  . Attention deficit hyperactivity disorder   . GERD (gastroesophageal reflux disease)   . Headache(784.0)     migraines after seizure  . Seizures (HCC)     grand mal, haven't had a seizure in "a long time"  . Epilepsy (HCC)   . Arthritis     gout in right big toe  . Gout    Past Surgical History  Procedure Laterality Date  . Colonoscopy    . Back surgery     Family History  Problem Relation Age of Onset  . Diabetes Paternal Grandmother    Social History  Substance Use Topics  . Smoking status: Current Every Day Smoker    Types: Cigarettes  . Smokeless tobacco: Never Used  . Alcohol Use: Yes     Comment: occasional/socially    Review of Systems 10 Systems reviewed and are negative for acute change except as noted in the HPI.    Allergies  Review of patient's allergies indicates no known allergies.  Home Medications   Prior to Admission medications   Medication Sig Start Date End Date Taking? Authorizing Provider  allopurinol (ZYLOPRIM) 100 MG tablet Take 2 tablets (200 mg  total) by mouth daily. 02/27/15  Yes Barbaraann Barthel, MD  cyclobenzaprine (FLEXERIL) 10 MG tablet Take 1 tablet (10 mg total) by mouth 2 (two) times daily as needed for muscle spasms. 02/26/15  Yes Barbaraann Barthel, MD  divalproex (DEPAKOTE) 500 MG DR tablet Take 1 tablet by mouth twice daily, with a  tablet. Total dose  twice daily 02/26/15  Yes Barbaraann Barthel, MD  ibuprofen (ADVIL,MOTRIN) 100 MG tablet Take 200-800 mg by mouth every 6 (six) hours as needed for pain.   Yes Historical Provider, MD  divalproex (DEPAKOTE) 250 MG DR tablet Take 1 tablet by mouth twice daily, with a  tablet. Total dose  twice daily 03/23/15   Quentin Angst, MD  HYDROcodone-acetaminophen (NORCO/VICODIN) 5-325 MG per tablet Take 1 tablet by mouth every 6 (six) hours as needed for moderate pain or severe pain. Patient not taking: Reported on 02/26/2015 05/25/14   Mellody Drown, PA-C  HYDROcodone-acetaminophen (NORCO/VICODIN) 5-325 MG tablet Take 1-2 tablets by mouth every 4 (four) hours as needed for moderate pain or severe pain. 03/23/15   Arby Barrette, MD  ibuprofen (ADVIL,MOTRIN) 800 MG tablet Take 1 tablet (800 mg total) by mouth 3 (three) times daily. Patient not taking: Reported on 02/26/2015 05/25/14   Mellody Drown, PA-C  ibuprofen (ADVIL,MOTRIN) 800 MG tablet Take 1 tablet (800 mg total) by mouth 3 (three) times daily.  03/23/15   Arby Barrette, MD  indomethacin (INDOCIN) 50 MG capsule Take 1 capsule (50 mg total) by mouth 3 (three) times daily with meals. Patient not taking: Reported on 02/26/2015 07/05/14   Earley Favor, NP  orphenadrine (NORFLEX) 100 MG tablet Take 1 tablet (100 mg total) by mouth 2 (two) times daily. 03/23/15   Arby Barrette, MD  topiramate (TOPAMAX) 100 MG tablet Take 1 tablet (100 mg total) by mouth 2 (two) times daily. 03/23/15   Quentin Angst, MD   BP 139/87 mmHg  Pulse 64  Temp(Src) 98.2 F (36.8 C) (Oral)  Resp 18  SpO2 97% Physical Exam  Constitutional: He  is oriented to person, place, and time. He appears well-developed and well-nourished.  Patient is lying on his left side. He does appear to be in severe pain with any movement. He is alert and nontoxic. Otherwise well appearance.  HENT:  Head: Normocephalic and atraumatic.  Eyes: EOM are normal. Pupils are equal, round, and reactive to light.  Neck: Neck supple.  Cardiovascular: Normal rate, regular rhythm, normal heart sounds and intact distal pulses.   Pulmonary/Chest: Effort normal and breath sounds normal.  Abdominal: Soft. Bowel sounds are normal. He exhibits no distension. There is no tenderness.  Musculoskeletal: Normal range of motion. He exhibits no edema.  Severe localizing pain in the central spine with attempts at straightening or twisting at the spine. He localizes approximately to the L3-L4 area.  Neurological: He is alert and oriented to person, place, and time. He has normal strength. No cranial nerve deficit. He exhibits normal muscle tone. Coordination normal. GCS eye subscore is 4. GCS verbal subscore is 5. GCS motor subscore is 6.  Skin: Skin is warm, dry and intact.  Psychiatric: He has a normal mood and affect.    ED Course  Procedures (including critical care time) Labs Review Labs Reviewed - No data to display  Imaging Review Mr Lumbar Spine Wo Contrast  03/23/2015  CLINICAL DATA:  Low back pain for 2 weeks. History of prior lumbar surgery. No known injury. Initial encounter. EXAM: MRI LUMBAR SPINE WITHOUT CONTRAST TECHNIQUE: Multiplanar, multisequence MR imaging of the lumbar spine was performed. No intravenous contrast was administered. COMPARISON:  CT abdomen and pelvis 05/25/2014. MRI lumbar spine 03/26/2013. FINDINGS: 1.3 cm anterolisthesis L5 on S1 and 0.7 cm retrolisthesis L4 on L5 are unchanged since the prior CT scan. Vertebral body alignment is otherwise normal. The conus medullaris is normal in signal and position. Convex left scoliosis is identified. Imaged  paraspinous structures are unremarkable. T11-12: A right lateral recess protrusion appears smaller than on the prior study. Disc does contact the distal cord. The foramina appear open. T12-L1 is imaged in the sagittal plane only and negative. L1-2:  Negative. L2-3:  Negative. L3-4:  Negative. L4-5: Shallow disc bulge without central canal or foraminal stenosis is unchanged. L5-S1: Status post laminectomy and fusion. The central canal is widely patent. Although motion somewhat degrades evaluation, foraminal narrowing seen on the prior examination appears improved. IMPRESSION: Status post L5-S1 fusion since the prior MRI. 1.3 cm anterolisthesis L5 on S1 is not markedly changed. Foraminal narrowing seen on the prior MRI appears improved. The central canal is widely patent. No change in a shallow disc bulge at L4-5 without central canal or foraminal stenosis. A right paracentral protrusion at T11-12 appears smaller than on the prior exam although the disc contacts the cord. No new abnormality since the prior exams. Electronically Signed   By: Drusilla Kanner  M.D.   On: 03/23/2015 13:41   I have personally reviewed and evaluated these images and lab results as part of my medical decision-making.   EKG Interpretation None      MDM   Final diagnoses:  Midline low back pain without sciatica   MRI has ruled out for any compressive disc herniation. At this point the patient will be treated for pain with ibuprofen, Norflex and Vicodin. He is advised to follow-up with his neurosurgeon or family physician. He is otherwise well with acute onset of pain with position change. He had noted some bowel movement changes however at this time no evidence of neurologic dysfunction. No abdominal pain, no vomiting, no fever.    Arby BarretteMarcy Sanela Evola, MD 03/23/15 [phone]

## 2015-03-23 NOTE — ED Notes (Signed)
Pt presents w/ back pain, worse with movement x 2 weeks. Pt c/o increased pain this morning. Also reports occasional bowel incontinence. Hx back surgery 2014 - L5 S1 fusion. Denies numbness/tingling in extremities.

## 2015-03-23 NOTE — Discharge Instructions (Signed)

## 2015-03-23 NOTE — ED Notes (Signed)
Doctor at bedside.

## 2015-05-07 ENCOUNTER — Encounter (HOSPITAL_COMMUNITY): Payer: Self-pay | Admitting: Emergency Medicine

## 2015-05-07 ENCOUNTER — Emergency Department (HOSPITAL_COMMUNITY)
Admission: EM | Admit: 2015-05-07 | Discharge: 2015-05-08 | Disposition: A | Discharge status: Home / Self Care | Payer: PRIVATE HEALTH INSURANCE | Attending: Emergency Medicine | Admitting: Emergency Medicine

## 2015-05-07 ENCOUNTER — Emergency Department (HOSPITAL_COMMUNITY): Payer: PRIVATE HEALTH INSURANCE

## 2015-05-07 DIAGNOSIS — Z79899 Other long term (current) drug therapy: Secondary | ICD-10-CM | POA: Insufficient documentation

## 2015-05-07 DIAGNOSIS — G40909 Epilepsy, unspecified, not intractable, without status epilepticus: Secondary | ICD-10-CM | POA: Insufficient documentation

## 2015-05-07 DIAGNOSIS — M10071 Idiopathic gout, right ankle and foot: Secondary | ICD-10-CM | POA: Insufficient documentation

## 2015-05-07 DIAGNOSIS — R0600 Dyspnea, unspecified: Secondary | ICD-10-CM | POA: Diagnosis not present

## 2015-05-07 DIAGNOSIS — F17210 Nicotine dependence, cigarettes, uncomplicated: Secondary | ICD-10-CM | POA: Diagnosis not present

## 2015-05-07 DIAGNOSIS — R112 Nausea with vomiting, unspecified: Secondary | ICD-10-CM | POA: Insufficient documentation

## 2015-05-07 DIAGNOSIS — H538 Other visual disturbances: Secondary | ICD-10-CM | POA: Insufficient documentation

## 2015-05-07 DIAGNOSIS — K921 Melena: Secondary | ICD-10-CM | POA: Diagnosis not present

## 2015-05-07 DIAGNOSIS — Z8659 Personal history of other mental and behavioral disorders: Secondary | ICD-10-CM | POA: Insufficient documentation

## 2015-05-07 DIAGNOSIS — R197 Diarrhea, unspecified: Secondary | ICD-10-CM

## 2015-05-07 DIAGNOSIS — R05 Cough: Secondary | ICD-10-CM | POA: Insufficient documentation

## 2015-05-07 LAB — COMPREHENSIVE METABOLIC PANEL
ALBUMIN: 4.7 g/dL (ref 3.5–5.0)
ALT: 44 U/L (ref 17–63)
AST: 23 U/L (ref 15–41)
Alkaline Phosphatase: 57 U/L (ref 38–126)
Anion gap: 12 (ref 5–15)
BUN: 15 mg/dL (ref 6–20)
CHLORIDE: 108 mmol/L (ref 101–111)
CO2: 23 mmol/L (ref 22–32)
Calcium: 9.3 mg/dL (ref 8.9–10.3)
Creatinine, Ser: 1.16 mg/dL (ref 0.61–1.24)
GFR calc Af Amer: >60 mL/min (ref >60)
GFR calc non Af Amer: >60 mL/min (ref >60)
GLUCOSE: 92 mg/dL (ref 65–99)
POTASSIUM: 3.8 mmol/L (ref 3.5–5.1)
Sodium: 143 mmol/L (ref 135–145)
Total Bilirubin: 0.5 mg/dL (ref 0.3–1.2)
Total Protein: 8.1 g/dL (ref 6.5–8.1)

## 2015-05-07 LAB — CBC
HCT: 47.3 % (ref 39.0–52.0)
Hemoglobin: 16.2 g/dL (ref 13.0–17.0)
MCH: 29.5 pg (ref 26.0–34.0)
MCHC: 34.2 g/dL (ref 30.0–36.0)
MCV: 86 fL (ref 78.0–100.0)
PLATELETS: 268 10*3/uL (ref 150–400)
RBC: 5.5 MIL/uL (ref 4.22–5.81)
RDW: 13.3 % (ref 11.5–15.5)
WBC: 8.9 10*3/uL (ref 4.0–10.5)

## 2015-05-07 LAB — POC OCCULT BLOOD, ED: FECAL OCCULT BLD: POSITIVE — AB

## 2015-05-07 LAB — I-STAT TROPONIN, ED: Troponin i, poc: 0 ng/mL (ref 0.00–0.08)

## 2015-05-07 MED ORDER — IPRATROPIUM-ALBUTEROL 0.5-2.5 (3) MG/3ML IN SOLN
3.0000 mL | Freq: Once | RESPIRATORY_TRACT | Status: AC
  Administered 2015-05-07: 3 mL via RESPIRATORY_TRACT
  Filled 2015-05-07: qty 3

## 2015-05-07 NOTE — ED Notes (Signed)
Pt c/o SOB when walking along with blurry vision and watery eyes. Pt states it has been happening all day and he just got over the flu. Pt states past 2 BM today have had blood in it. Last one full of blood and watery. Denies pain.

## 2015-05-07 NOTE — ED Provider Notes (Signed)
CSN: [id number]     Arrival date & time 05/07/15  2044 History   First MD Initiated Contact with Patient 05/07/15 2256     Chief Complaint  Patient presents with  . Shortness of Breath  . Blurred Vision  . Rectal Bleeding     (Consider location/radiation/quality/duration/timing/severity/associated sxs/prior Treatment) Patient is a 30 y.o. male presenting with shortness of breath and hematochezia. The history is provided by the patient.  Shortness of Breath Rectal Bleeding He had flulike symptoms for the last 2 days consisting of cough, body aches, nausea, vomiting. Today, he has noted dyspnea with exertion and some blurring of his vision. He is also noted blood in his stool. Stools have been loose. He has been on Tamiflu recently but no antibiotics. He denies abdominal pain or chest pain. Today, he denies fever, chills, sweats. He continues to have some myalgias. He denies any sick contacts.  Past Medical History  Diagnosis Date  . Attention deficit hyperactivity disorder   . GERD (gastroesophageal reflux disease)   . Headache(784.0)     migraines after seizure  . Seizures (HCC)     grand mal, haven't had a seizure in "a long time"  . Epilepsy (HCC)   . Arthritis     gout in right big toe  . Gout    Past Surgical History  Procedure Laterality Date  . Colonoscopy    . Back surgery     Family History  Problem Relation Age of Onset  . Diabetes Paternal Grandmother    Social History  Substance Use Topics  . Smoking status: Current Every Day Smoker    Types: Cigarettes  . Smokeless tobacco: Never Used  . Alcohol Use: Yes     Comment: occasional/socially    Review of Systems  Respiratory: Positive for shortness of breath.   Gastrointestinal: Positive for hematochezia.  All other systems reviewed and are negative.     Allergies  Review of patient's allergies indicates no known allergies.  Home Medications   Prior to Admission medications   Medication Sig Start  Date End Date Taking? Authorizing Provider  albuterol (PROAIR HFA) 108 (90 Base) MCG/ACT inhaler Inhale 2 puffs into the lungs every 6 (six) hours as needed for wheezing or shortness of breath.  04/02/15  Yes Historical Provider, MD  allopurinol (ZYLOPRIM) 100 MG tablet Take 2 tablets (200 mg total) by mouth daily. 02/27/15  Yes Barbaraann Barthel, MD  cyclobenzaprine (FLEXERIL) 10 MG tablet Take 1 tablet (10 mg total) by mouth 2 (two) times daily as needed for muscle spasms. 02/26/15  Yes Barbaraann Barthel, MD  divalproex (DEPAKOTE) 250 MG DR tablet Take 1 tablet by mouth twice daily, with a  tablet. Total dose  twice daily Patient taking differently: Take 250 mg by mouth 2 (two) times daily. Take 1 tablet by mouth twice daily, with a  tablet. Total dose  twice daily 03/23/15  Yes Olugbemiga E Hyman Hopes, MD  divalproex (DEPAKOTE) 500 MG DR tablet Take 1 tablet by mouth twice daily, with a  tablet. Total dose  twice daily 02/26/15  Yes Barbaraann Barthel, MD  fluticasone [facility]) 50 MCG/ACT nasal spray Place 2 sprays into the nose daily as needed for allergies.  04/01/15  Yes Historical Provider, MD  oseltamivir (TAMIFLU) 30 MG capsule Take 60 mg by mouth 2 (two) times daily as needed (flu like symptoms).   Yes Historical Provider, MD  topiramate (TOPAMAX) 100 MG tablet Take 1 tablet (100 mg total) by mouth  2 (two) times daily. 03/23/15  Yes Quentin Angst, MD  HYDROcodone-acetaminophen (NORCO/VICODIN) 5-325 MG per tablet Take 1 tablet by mouth every 6 (six) hours as needed for moderate pain or severe pain. Patient not taking: Reported on 02/26/2015 05/25/14   Mellody Drown, PA-C  HYDROcodone-acetaminophen (NORCO/VICODIN) 5-325 MG tablet Take 1-2 tablets by mouth every 4 (four) hours as needed for moderate pain or severe pain. Patient not taking: Reported on 05/07/2015 03/23/15   Arby Barrette, MD  ibuprofen (ADVIL,MOTRIN) 100 MG tablet Take 200-800 mg by mouth every 6 (six) hours as  needed for pain.    Historical Provider, MD  ibuprofen (ADVIL,MOTRIN) 800 MG tablet Take 1 tablet (800 mg total) by mouth 3 (three) times daily. Patient not taking: Reported on 02/26/2015 05/25/14   Mellody Drown, PA-C  ibuprofen (ADVIL,MOTRIN) 800 MG tablet Take 1 tablet (800 mg total) by mouth 3 (three) times daily. Patient not taking: Reported on 05/07/2015 03/23/15   Arby Barrette, MD  indomethacin (INDOCIN) 50 MG capsule Take 1 capsule (50 mg total) by mouth 3 (three) times daily with meals. Patient not taking: Reported on 02/26/2015 07/05/14   Earley Favor, NP  orphenadrine (NORFLEX) 100 MG tablet Take 1 tablet (100 mg total) by mouth 2 (two) times daily. Patient not taking: Reported on 05/07/2015 03/23/15   Arby Barrette, MD   BP 144/71 mmHg  Pulse 96  Temp(Src) 98.3 F (36.8 C) (Oral)  Resp 16  SpO2 99% Physical Exam  Nursing note and vitals reviewed.  30 year old male, resting comfortably and in no acute distress. Vital signs are significant for mild hypertension. Oxygen saturation is 99%, which is normal. Head is normocephalic and atraumatic. PERRLA, EOMI. Oropharynx is clear. Neck is nontender and supple without adenopathy or JVD. Back is nontender and there is no CVA tenderness. Lungs are clear without rales, wheezes, or rhonchi. Chest is nontender. Heart has regular rate and rhythm without murmur. Abdomen is soft, flat, nontender without masses or hepatosplenomegaly and peristalsis is normoactive. Rectal: Normal sphincter tone, no masses, prostate normal size. Stool is light brown and is sent for Hemoccult testing. Extremities have no cyanosis or edema, full range of motion is present. Skin is warm and dry without rash. Neurologic: Mental status is normal, cranial nerves are intact, there are no motor or sensory deficits.  ED Course  Procedures (including critical care time) Labs Review Results for orders placed or performed during the hospital encounter of 05/07/15  CBC   Result Value Ref Range   WBC 8.9 4.0 - 10.5 K/uL   RBC 5.50 4.22 - 5.81 MIL/uL   Hemoglobin 16.2 13.0 - 17.0 g/dL   HCT 04.5 40.9 - 81.1 %   MCV 86.0 78.0 - 100.0 fL   MCH 29.5 26.0 - 34.0 pg   MCHC 34.2 30.0 - 36.0 g/dL   RDW 91.4 78.2 - 95.6 %   Platelets 268 150 - 400 K/uL  Comprehensive metabolic panel  Result Value Ref Range   Sodium 143 135 - 145 mmol/L   Potassium 3.8 3.5 - 5.1 mmol/L   Chloride 108 101 - 111 mmol/L   CO2 23 22 - 32 mmol/L   Glucose, Bld 92 65 - 99 mg/dL   BUN 15 6 - 20 mg/dL   Creatinine, Ser 2.13 0.61 - 1.24 mg/dL   Calcium 9.3 8.9 - 08.6 mg/dL   Total Protein 8.1 6.5 - 8.1 g/dL   Albumin 4.7 3.5 - 5.0 g/dL   AST 23 15 -  41 U/L   ALT 44 17 - 63 U/L   Alkaline Phosphatase 57 38 - 126 U/L   Total Bilirubin 0.5 0.3 - 1.2 mg/dL   GFR calc non Af Amer >60 >60 mL/min   GFR calc Af Amer >60 >60 mL/min   Anion gap 12 5 - 15  I-stat troponin, ED (not at [facility], [facility])  Result Value Ref Range   Troponin i, poc 0.00 0.00 - 0.08 ng/mL   Comment 3          POC occult blood, ED Provider will collect  Result Value Ref Range   Fecal Occult Bld POSITIVE (A) NEGATIVE   Imaging Review Dg Chest 2 View  05/07/2015  CLINICAL DATA:  Acute onset of shortness of breath with exertion. Intermittent fever and productive cough. Chest pressure. Initial encounter. EXAM: CHEST  2 VIEW COMPARISON:  Chest radiograph performed 03/25/2013 FINDINGS: The lungs are well-aerated and clear. There is no evidence of focal opacification, pleural effusion or pneumothorax. The heart is normal in size; the mediastinal contour is within normal limits. No acute osseous abnormalities are seen. IMPRESSION: No acute cardiopulmonary process seen. Electronically Signed   By: Roanna Raider M.D.   On: 05/07/2015 21:16   I have personally reviewed and evaluated these images and lab results as part of my medical decision-making.   MDM   Final diagnoses:  Dyspnea  Bloody diarrhea    Dyspnea on  exertion with no objective findings. Chest x-ray is unremarkable and respiratory rate is normal. Alleged rectal bleeding but hemoglobin is unchanged from one year ago and blood pressure and heart rate are normal. Will check orthostatic vital signs and will give a therapeutic trial of albuterol with ipratropium.  Stool Hemoccult is positive. He did have subjective relief with nebulizer treatments and he is given a dose of dexamethasone and given an albuterol inhaler to take home. Because of Hemoccult-positive stool, he will be put on 3 days of Cipro 500 mg twice a day. Of note, orthostatic vital signs show no significant change in pulse or blood pressure.   Dione Booze, MD 05/08/15 [phone]

## 2015-05-08 DIAGNOSIS — K921 Melena: Secondary | ICD-10-CM | POA: Diagnosis not present

## 2015-05-08 MED ORDER — CIPROFLOXACIN HCL 500 MG PO TABS
500.0000 mg | ORAL_TABLET | Freq: Once | ORAL | Status: AC
  Administered 2015-05-08: 500 mg via ORAL
  Filled 2015-05-08: qty 1

## 2015-05-08 MED ORDER — ALBUTEROL SULFATE HFA 108 (90 BASE) MCG/ACT IN AERS
2.0000 | INHALATION_SPRAY | RESPIRATORY_TRACT | Status: DC | PRN
  Administered 2015-05-08: 2 via RESPIRATORY_TRACT
  Filled 2015-05-08: qty 6.7

## 2015-05-08 MED ORDER — CIPROFLOXACIN HCL 500 MG PO TABS: 500.0000 mg | ORAL_TABLET | Freq: Two times a day (BID) | ORAL | Status: DC

## 2015-05-08 MED ORDER — DEXAMETHASONE 4 MG PO TABS
12.0000 mg | ORAL_TABLET | Freq: Once | ORAL | Status: AC
  Administered 2015-05-08: 12 mg via ORAL
  Filled 2015-05-08: qty 3

## 2015-05-08 NOTE — Discharge Instructions (Signed)
Shortness of Breath Shortness of breath means you have trouble breathing. It could also mean that you have a medical problem. You should get immediate medical care for shortness of breath. CAUSES   Not enough oxygen in the air such as with high altitudes or a smoke-filled room.  Certain lung diseases, infections, or problems.  Heart disease or conditions, such as angina or heart failure.  Low red blood cells (anemia).  Poor physical fitness, which can cause shortness of breath when you exercise.  Chest or back injuries or stiffness.  Being overweight.  Smoking.  Anxiety, which can make you feel like you are not getting enough air. DIAGNOSIS  Serious medical problems can often be found during your physical exam. Tests may also be done to determine why you are having shortness of breath. Tests may include:  Chest X-rays.  Lung function tests.  Blood tests.  An electrocardiogram (ECG).  An ambulatory electrocardiogram. An ambulatory ECG records your heartbeat patterns over a 24-hour period.  Exercise testing.  A transthoracic echocardiogram (TTE). During echocardiography, sound waves are used to evaluate how blood flows through your heart.  A transesophageal echocardiogram (TEE).  Imaging scans. Your health care provider may not be able to find a cause for your shortness of breath after your exam. In this case, it is important to have a follow-up exam with your health care provider as directed.  TREATMENT  Treatment for shortness of breath depends on the cause of your symptoms and can vary greatly. HOME CARE INSTRUCTIONS   Do not smoke. Smoking is a common cause of shortness of breath. If you smoke, ask for help to quit.  Avoid being around chemicals or things that may bother your breathing, such as paint fumes and dust.  Rest as needed. Slowly resume your usual activities.  If medicines were prescribed, take them as directed for the full length of time directed. This  includes oxygen and any inhaled medicines.  Keep all follow-up appointments as directed by your health care provider. SEEK MEDICAL CARE IF:   Your condition does not improve in the time expected.  You have a hard time doing your normal activities even with rest.  You have any new symptoms. SEEK IMMEDIATE MEDICAL CARE IF:   Your shortness of breath gets worse.  You feel light-headed, faint, or develop a cough not controlled with medicines.  You start coughing up blood.  You have pain with breathing.  You have chest pain or pain in your arms, shoulders, or abdomen.  You have a fever.  You are unable to walk up stairs or exercise the way you normally do. MAKE SURE YOU:  Understand these instructions.  Will watch your condition.  Will get help right away if you are not doing well or get worse.   This information is not intended to replace advice given to you by your health care provider. Make sure you discuss any questions you have with your health care provider.   Document Released: 12/21/2000 Document Revised: 04/02/2013 Document Reviewed: 06/13/2011 Elsevier Interactive Patient Education 2016 [employer]  Bloody Diarrhea Bloody diarrhea can be caused by many different conditions. Most of the time bloody diarrhea is the result of food poisoning or minor infections. Bloody diarrhea usually improves over 2 to 3 days of rest and fluid replacement. Other conditions that can cause bloody diarrhea include:  Internal bleeding.  Infection.  Diseases of the bowel and colon. Internal bleeding from an ulcer or bowel disease can be severe and  requires hospital care or even surgery. DIAGNOSIS  To find out what is wrong your caregiver may check your:  Stool.  Blood.  Results from a test that looks inside the body (endoscopy). TREATMENT   Get plenty of rest.  Drink enough water and fluids to keep your urine clear or pale yellow.  Do not smoke.  Solid foods and dairy  products should be avoided until your illness improves.  As you improve, slowly return to a regular diet with easily-digested foods first. Examples are:  Bananas.  Rice.  Toast.  Crackers. You should only need these for about 2 days before adding more normal foods to your diet.  Avoid spicy or fatty foods as well as caffeine and alcohol for several days.  Medicine to control cramping and diarrhea can relieve symptoms but may prolong some cases of bloody diarrhea. Antibiotics can speed recovery from diarrhea due to some bacterial infections. Call your caregiver if diarrhea does not get better in 3 days. SEEK MEDICAL CARE IF:   You do not improve after 3 days.  Your diarrhea improves but your stool appears black. SEEK IMMEDIATE MEDICAL CARE IF:   You become extremely weak or faint.  You become very sweaty.  You have increased pain or bleeding.  You develop repeated vomiting.  You vomit and you see blood or the vomit looks black in color.  You have a fever.   This information is not intended to replace advice given to you by your health care provider. Make sure you discuss any questions you have with your health care provider.   Document Released: 03/28/2005 Document Revised: 04/18/2014 Document Reviewed: 02/27/2009 Elsevier Interactive Patient Education 2016 [employer]  Ciprofloxacin tablets What is this medicine? CIPROFLOXACIN (sip roe FLOX a sin) is a quinolone antibiotic. It is used to treat certain kinds of bacterial infections. It will not work for colds, flu, or other viral infections. This medicine may be used for other purposes; ask your health care provider or pharmacist if you have questions. What should I tell my health care provider before I take this medicine? They need to know if you have any of these conditions: -bone problems -cerebral disease -history of low levels of potassium in the blood -joint problems -irregular heartbeat -kidney  disease -myasthenia gravis -seizures -tendon problems -tingling of the fingers or toes, or other nerve disorder -an unusual or allergic reaction to ciprofloxacin, other antibiotics or medicines, foods, dyes, or preservatives -pregnant or trying to get pregnant -breast-feeding How should I use this medicine? Take this medicine by mouth with a glass of water. Follow the directions on the prescription label. Take your medicine at regular intervals. Do not take your medicine more often than directed. Take all of your medicine as directed even if you think your are better. Do not skip doses or stop your medicine early. You can take this medicine with food or on an empty stomach. It can be taken with a meal that contains dairy or calcium, but do not take it alone with a dairy product, like milk or yogurt or calcium-fortified juice. A special MedGuide will be given to you by the pharmacist with each prescription and refill. Be sure to read this information carefully each time. Talk to your pediatrician regarding the use of this medicine in children. Special care may be needed. Overdosage: If you think you have taken too much of this medicine contact a poison control center or emergency room at once. NOTE: This medicine is only for  you. Do not share this medicine with others. What if I miss a dose? If you miss a dose, take it as soon as you can. If it is almost time for your next dose, take only that dose. Do not take double or extra doses. What may interact with this medicine? Do not take this medicine with any of the following medications: -cisapride -droperidol -terfenadine -tizanidine This medicine may also interact with the following medications: -antacids -birth control pills -caffeine -cyclosporin -didanosine (ddI) buffered tablets or powder -medicines for diabetes -medicines for inflammation like ibuprofen,  naproxen -methotrexate -multivitamins -omeprazole -phenytoin -probenecid -sucralfate -theophylline -warfarin This list may not describe all possible interactions. Give your health care provider a list of all the medicines, herbs, non-prescription drugs, or dietary supplements you use. Also tell them if you smoke, drink alcohol, or use illegal drugs. Some items may interact with your medicine. What should I watch for while using this medicine? Tell your doctor or health care professional if your symptoms do not improve. Do not treat diarrhea with over the counter products. Contact your doctor if you have diarrhea that lasts more than 2 days or if it is severe and watery. You may get drowsy or dizzy. Do not drive, use machinery, or do anything that needs mental alertness until you know how this medicine affects you. Do not stand or sit up quickly, especially if you are an older patient. This reduces the risk of dizzy or fainting spells. This medicine can make you more sensitive to the sun. Keep out of the sun. If you cannot avoid being in the sun, wear protective clothing and use sunscreen. Do not use sun lamps or tanning beds/booths. Avoid antacids, aluminum, calcium, iron, magnesium, and zinc products for 6 hours before and 2 hours after taking a dose of this medicine. What side effects may I notice from receiving this medicine? Side effects that you should report to your doctor or health care professional as soon as possible: -allergic reactions like skin rash or hives, swelling of the face, lips, or tongue -anxious -confusion -depressed mood -diarrhea -fast, irregular heartbeat -hallucination, loss of contact with reality -joint, muscle, or tendon pain or swelling -pain, tingling, numbness in the hands or feet -suicidal thoughts or other mood changes -sunburn -unusually weak or tired Side effects that usually do not require medical attention (report to your doctor or health care  professional if they continue or are bothersome): -dry mouth -headache -nausea -trouble sleeping This list may not describe all possible side effects. Call your doctor for medical advice about side effects. You may report side effects to FDA at 1-800-FDA-1088. Where should I keep my medicine? Keep out of the reach of children. Store at room temperature below 30 degrees C (86 degrees F). Keep container tightly closed. Throw away any unused medicine after the expiration date. NOTE: This sheet is a summary. It may not cover all possible information. If you have questions about this medicine, talk to your doctor, pharmacist, or health care provider.    2016, [employer]. (2014-11-06 12:57:02)  Albuterol inhalation aerosol What is this medicine? ALBUTEROL (al Gaspar Bidding) is a bronchodilator. It helps open up the airways in your lungs to make it easier to breathe. This medicine is used to treat and to prevent bronchospasm. This medicine may be used for other purposes; ask your health care provider or pharmacist if you have questions. What should I tell my health care provider before I take this medicine? They need to  know if you have any of the following conditions: -diabetes -heart disease or irregular heartbeat -high blood pressure -pheochromocytoma -seizures -thyroid disease -an unusual or allergic reaction to albuterol, levalbuterol, sulfites, other medicines, foods, dyes, or preservatives -pregnant or trying to get pregnant -breast-feeding How should I use this medicine? This medicine is for inhalation through the mouth. Follow the directions on your prescription label. Take your medicine at regular intervals. Do not use more often than directed. Make sure that you are using your inhaler correctly. Ask you doctor or health care provider if you have any questions. Talk to your pediatrician regarding the use of this medicine in children. Special care may be  needed. Overdosage: If you think you have taken too much of this medicine contact a poison control center or emergency room at once. NOTE: This medicine is only for you. Do not share this medicine with others. What if I miss a dose? If you miss a dose, use it as soon as you can. If it is almost time for your next dose, use only that dose. Do not use double or extra doses. What may interact with this medicine? -anti-infectives like chloroquine and pentamidine -caffeine -cisapride -diuretics -medicines for colds -medicines for depression or for emotional or psychotic conditions -medicines for weight loss including some herbal products -methadone -some antibiotics like clarithromycin, erythromycin, levofloxacin, and linezolid -some heart medicines -steroid hormones like dexamethasone, cortisone, hydrocortisone -theophylline -thyroid hormones This list may not describe all possible interactions. Give your health care provider a list of all the medicines, herbs, non-prescription drugs, or dietary supplements you use. Also tell them if you smoke, drink alcohol, or use illegal drugs. Some items may interact with your medicine. What should I watch for while using this medicine? Tell your doctor or health care professional if your symptoms do not improve. Do not use extra albuterol. If your asthma or bronchitis gets worse while you are using this medicine, call your doctor right away. If your mouth gets dry try chewing sugarless gum or sucking hard candy. Drink water as directed. What side effects may I notice from receiving this medicine? Side effects that you should report to your doctor or health care professional as soon as possible: -allergic reactions like skin rash, itching or hives, swelling of the face, lips, or tongue -breathing problems -chest pain -feeling faint or lightheaded, falls -high blood pressure -irregular heartbeat -fever -muscle cramps or weakness -pain, tingling,  numbness in the hands or feet -vomiting Side effects that usually do not require medical attention (report to your doctor or health care professional if they continue or are bothersome): -cough -difficulty sleeping -headache -nervousness or trembling -stomach upset -stuffy or runny nose -throat irritation -unusual taste This list may not describe all possible side effects. Call your doctor for medical advice about side effects. You may report side effects to FDA at 1-800-FDA-1088. Where should I keep my medicine? Keep out of the reach of children. Store at room temperature between 15 and 30 degrees C (59 and 86 degrees F). The contents are under pressure and may burst when exposed to heat or flame. Do not freeze. This medicine does not work as well if it is too cold. Throw away any unused medicine after the expiration date. Inhalers need to be thrown away after the labeled number of puffs have been used or by the expiration date; whichever comes first. Ventolin HFA should be thrown away 12 months after removing from foil pouch. Check the instructions that come with  your medicine. NOTE: This sheet is a summary. It may not cover all possible information. If you have questions about this medicine, talk to your doctor, pharmacist, or health care provider.    2016, [employer]. (2012-09-13 10:57:17)

## 2015-11-25 ENCOUNTER — Encounter (HOSPITAL_COMMUNITY): Payer: Self-pay | Admitting: Emergency Medicine

## 2015-11-25 ENCOUNTER — Emergency Department (HOSPITAL_COMMUNITY): Payer: PRIVATE HEALTH INSURANCE

## 2015-11-25 ENCOUNTER — Emergency Department (HOSPITAL_COMMUNITY)
Admission: EM | Admit: 2015-11-25 | Discharge: 2015-11-25 | Disposition: A | Discharge status: Home / Self Care | Payer: PRIVATE HEALTH INSURANCE | Attending: Emergency Medicine | Admitting: Emergency Medicine

## 2015-11-25 DIAGNOSIS — F909 Attention-deficit hyperactivity disorder, unspecified type: Secondary | ICD-10-CM | POA: Insufficient documentation

## 2015-11-25 DIAGNOSIS — R1012 Left upper quadrant pain: Secondary | ICD-10-CM | POA: Insufficient documentation

## 2015-11-25 DIAGNOSIS — R112 Nausea with vomiting, unspecified: Secondary | ICD-10-CM | POA: Diagnosis present

## 2015-11-25 DIAGNOSIS — R197 Diarrhea, unspecified: Secondary | ICD-10-CM | POA: Insufficient documentation

## 2015-11-25 DIAGNOSIS — R1032 Left lower quadrant pain: Secondary | ICD-10-CM | POA: Diagnosis not present

## 2015-11-25 DIAGNOSIS — Z79899 Other long term (current) drug therapy: Secondary | ICD-10-CM | POA: Diagnosis not present

## 2015-11-25 DIAGNOSIS — G40409 Other generalized epilepsy and epileptic syndromes, not intractable, without status epilepticus: Secondary | ICD-10-CM

## 2015-11-25 DIAGNOSIS — G40309 Generalized idiopathic epilepsy and epileptic syndromes, not intractable, without status epilepticus: Secondary | ICD-10-CM

## 2015-11-25 DIAGNOSIS — F17210 Nicotine dependence, cigarettes, uncomplicated: Secondary | ICD-10-CM | POA: Insufficient documentation

## 2015-11-25 LAB — CBC WITH DIFFERENTIAL/PLATELET
BASOS ABS: 0.1 10*3/uL (ref 0.0–0.1)
BASOS PCT: 1 %
EOS ABS: 0.2 10*3/uL (ref 0.0–0.7)
EOS PCT: 2 %
HCT: 48.2 % (ref 39.0–52.0)
HEMOGLOBIN: 16.8 g/dL (ref 13.0–17.0)
Lymphocytes Relative: 34 %
Lymphs Abs: 2.7 10*3/uL (ref 0.7–4.0)
MCH: 29.7 pg (ref 26.0–34.0)
MCHC: 34.9 g/dL (ref 30.0–36.0)
MCV: 85.3 fL (ref 78.0–100.0)
Monocytes Absolute: 1 10*3/uL (ref 0.1–1.0)
Monocytes Relative: 12 %
NEUTROS PCT: 51 %
Neutro Abs: 4.2 10*3/uL (ref 1.7–7.7)
PLATELETS: 263 10*3/uL (ref 150–400)
RBC: 5.65 MIL/uL (ref 4.22–5.81)
RDW: 13.6 % (ref 11.5–15.5)
WBC: 8.1 10*3/uL (ref 4.0–10.5)

## 2015-11-25 LAB — URINALYSIS, ROUTINE W REFLEX MICROSCOPIC
BILIRUBIN URINE: NEGATIVE
Glucose, UA: NEGATIVE mg/dL
Hgb urine dipstick: NEGATIVE
Ketones, ur: NEGATIVE mg/dL
Leukocytes, UA: NEGATIVE
NITRITE: NEGATIVE
PH: 6.5 (ref 5.0–8.0)
Protein, ur: NEGATIVE mg/dL
SPECIFIC GRAVITY, URINE: 1.025 (ref 1.005–1.030)

## 2015-11-25 LAB — COMPREHENSIVE METABOLIC PANEL
ALBUMIN: 4.5 g/dL (ref 3.5–5.0)
ALK PHOS: 61 U/L (ref 38–126)
ALT: 44 U/L (ref 17–63)
AST: 26 U/L (ref 15–41)
Anion gap: 9 (ref 5–15)
BUN: 13 mg/dL (ref 6–20)
CALCIUM: 9.2 mg/dL (ref 8.9–10.3)
CHLORIDE: 108 mmol/L (ref 101–111)
CO2: 21 mmol/L — AB (ref 22–32)
CREATININE: 1.06 mg/dL (ref 0.61–1.24)
GFR calc Af Amer: >60 mL/min (ref >60)
GFR calc non Af Amer: >60 mL/min (ref >60)
GLUCOSE: 114 mg/dL — AB (ref 65–99)
Potassium: 3.6 mmol/L (ref 3.5–5.1)
SODIUM: 138 mmol/L (ref 135–145)
Total Bilirubin: 0.8 mg/dL (ref 0.3–1.2)
Total Protein: 7.9 g/dL (ref 6.5–8.1)

## 2015-11-25 LAB — LIPASE, BLOOD: Lipase: 24 U/L (ref 11–51)

## 2015-11-25 MED ORDER — SODIUM CHLORIDE 0.9 % IV BOLUS (SEPSIS)
1000.0000 mL | Freq: Once | INTRAVENOUS | Status: AC
  Administered 2015-11-25: 1000 mL via INTRAVENOUS

## 2015-11-25 MED ORDER — ONDANSETRON HCL 4 MG PO TABS: 4.0000 mg | ORAL_TABLET | Freq: Four times a day (QID) | ORAL | 0 refills | Status: DC

## 2015-11-25 MED ORDER — DICYCLOMINE HCL 20 MG PO TABS: 20.0000 mg | ORAL_TABLET | Freq: Two times a day (BID) | ORAL | 0 refills | Status: DC

## 2015-11-25 MED ORDER — HYDROCODONE-ACETAMINOPHEN 5-325 MG PO TABS: 2.0000 | ORAL_TABLET | ORAL | 0 refills | Status: DC | PRN

## 2015-11-25 MED ORDER — KETOROLAC TROMETHAMINE 30 MG/ML IJ SOLN
30.0000 mg | Freq: Once | INTRAMUSCULAR | Status: AC
  Administered 2015-11-25: 30 mg via INTRAVENOUS
  Filled 2015-11-25: qty 1

## 2015-11-25 MED ORDER — ONDANSETRON HCL 4 MG/2ML IJ SOLN
4.0000 mg | Freq: Once | INTRAMUSCULAR | Status: AC
  Administered 2015-11-25: 4 mg via INTRAVENOUS
  Filled 2015-11-25: qty 2

## 2015-11-25 MED ORDER — HYDROMORPHONE HCL 1 MG/ML IJ SOLN
1.0000 mg | Freq: Once | INTRAMUSCULAR | Status: AC
  Administered 2015-11-25: 1 mg via INTRAVENOUS
  Filled 2015-11-25: qty 1

## 2015-11-25 NOTE — ED Provider Notes (Signed)
[facility]-EMERGENCY DEPT Provider Note   CSN: [id number] Arrival date & time: 11/25/15  4098     History   Chief Complaint Chief Complaint  Patient presents with  . Abdominal Pain  . Emesis    HPI Christopher Gilbert is a 30 y.o. male who presents with L sided abdominal pain for the past 2 days. PMH significant for epilepsy, GERD, gout. He states that the pain has been constant on the L side and radiates to the L flank. It feels like "something is trying to come out of me". He feels like he can't get in comfortable position. Nothing has made it better. He reports associated decreased appetite, N/V/D, dysuria, dark urine, and suprapubic pressure. Denies fever, chills, chest pain, SOB, hematemesis, melena/hematochezia, penile discharge, testicular pain.   HPI  Past Medical History:  Diagnosis Date  . Arthritis    gout in right big toe  . Attention deficit hyperactivity disorder   . Epilepsy (HCC)   . GERD (gastroesophageal reflux disease)   . Gout   . Headache(784.0)    migraines after seizure  . Seizures (HCC)    grand mal, haven't had a seizure in "a long time"    Patient Active Problem List   Diagnosis Date Noted  . Gouty arthritis 08/14/2014  . Epilepsy, grand mal (HCC) 02/11/2014  . Status epilepticus (HCC) 11/23/2013  . Lactic acidosis 11/23/2013  . Gout 11/23/2013  . Spondylolisthesis of lumbar region 04/05/2013    Past Surgical History:  Procedure Laterality Date  . BACK SURGERY    . COLONOSCOPY         Home Medications    Prior to Admission medications   Medication Sig Start Date End Date Taking? Authorizing Provider  albuterol (PROAIR HFA) 108 (90 Base) MCG/ACT inhaler Inhale 2 puffs into the lungs every 6 (six) hours as needed for wheezing or shortness of breath.  04/02/15   Historical Provider, MD  allopurinol (ZYLOPRIM) 100 MG tablet Take 2 tablets (200 mg total) by mouth daily. 02/27/15   Barbaraann Barthel, MD  ciprofloxacin (CIPRO) 500 MG tablet Take  1 tablet (500 mg total) by mouth 2 (two) times daily. 05/08/15   Dione Booze, MD  cyclobenzaprine (FLEXERIL) 10 MG tablet Take 1 tablet (10 mg total) by mouth 2 (two) times daily as needed for muscle spasms. 02/26/15   Barbaraann Barthel, MD  divalproex (DEPAKOTE) 250 MG DR tablet Take 1 tablet by mouth twice daily, with a 500mg  tablet. Total dose 750mg  twice daily Patient taking differently: Take 250 mg by mouth 2 (two) times daily. Take 1 tablet by mouth twice daily, with a 500mg  tablet. Total dose 750mg  twice daily 03/23/15   Quentin Angst, MD  divalproex (DEPAKOTE) 500 MG DR tablet Take 1 tablet by mouth twice daily, with a 250mg  tablet. Total dose 750mg  twice daily 02/26/15   Barbaraann Barthel, MD  fluticasone [facility]) 50 MCG/ACT nasal spray Place 2 sprays into the nose daily as needed for allergies.  04/01/15   Historical Provider, MD  oseltamivir (TAMIFLU) 30 MG capsule Take 60 mg by mouth 2 (two) times daily as needed (flu like symptoms).    Historical Provider, MD  topiramate (TOPAMAX) 100 MG tablet Take 1 tablet (100 mg total) by mouth 2 (two) times daily. 03/23/15   Quentin Angst, MD    Family History Family History  Problem Relation Age of Onset  . Diabetes Paternal Grandmother     Social History Social History  Substance  Use Topics  . Smoking status: Current Every Day Smoker    Types: Cigarettes  . Smokeless tobacco: Never Used  . Alcohol use Yes     Comment: occasional/socially     Allergies   Review of patient's allergies indicates no known allergies.   Review of Systems Review of Systems  Constitutional: Positive for appetite change. Negative for chills and fever.  Respiratory: Negative for shortness of breath.   Cardiovascular: Negative for chest pain.  Gastrointestinal: Positive for abdominal pain. Negative for diarrhea, nausea and vomiting.  Genitourinary: Positive for dysuria and flank pain. Negative for discharge, penile pain and testicular pain.  All  other systems reviewed and are negative.    Physical Exam Updated Vital Signs BP 125/89 (BP Location: Left Arm)   Pulse 78   Temp 97.9 F (36.6 C) (Oral)   Resp 16   Ht 5\' 11"  (1.803 m)   Wt 116.6 kg   SpO2 97%   BMI 35.84 kg/m   Physical Exam  Constitutional: He is oriented to person, place, and time. He appears well-developed and well-nourished. He appears ill. No distress.  HENT:  Head: Normocephalic and atraumatic.  Eyes: Conjunctivae are normal. Pupils are equal, round, and reactive to light. Right eye exhibits no discharge. Left eye exhibits no discharge. No scleral icterus.  Neck: Normal range of motion. Neck supple.  Cardiovascular: Normal rate and regular rhythm.  Exam reveals no gallop and no friction rub.   No murmur heard. Pulmonary/Chest: Effort normal and breath sounds normal. No respiratory distress. He has no wheezes. He has no rales. He exhibits no tenderness.  Abdominal: Soft. Bowel sounds are normal. He exhibits no distension and no mass. There is tenderness. There is no rebound and no guarding. No hernia.  L CVA tenderness; L sided abdominal tenderness  Musculoskeletal: He exhibits no edema.  Neurological: He is alert and oriented to person, place, and time.  Skin: Skin is warm and dry.  Psychiatric: He has a normal mood and affect.  Nursing note and vitals reviewed.    ED Treatments / Results  Labs (all labs ordered are listed, but only abnormal results are displayed) Labs Reviewed  COMPREHENSIVE METABOLIC PANEL - Abnormal; Notable for the following:       Result Value   CO2 21 (*)    Glucose, Bld 114 (*)    All other components within normal limits  URINALYSIS, ROUTINE W REFLEX MICROSCOPIC (NOT AT [facility]) - Abnormal; Notable for the following:    Color, Urine AMBER (*)    All other components within normal limits  CBC WITH DIFFERENTIAL/PLATELET  LIPASE, BLOOD    EKG  EKG Interpretation None       Radiology Ct Renal Stone Study  Result  Date: 11/25/2015 CLINICAL DATA:  Left lower quadrant and left flank pain for 2 days. Nausea and vomiting. EXAM: CT ABDOMEN AND PELVIS WITHOUT CONTRAST TECHNIQUE: Multidetector CT imaging of the abdomen and pelvis was performed following the standard protocol without oral or intravenous contrast material administration. COMPARISON:  May 25, 2014 FINDINGS: Lower chest:  Lung bases are clear. Hepatobiliary: No focal liver lesions are appreciable on this noncontrast enhanced study. The gallbladder wall is not appreciably thickened. There is no biliary duct dilatation. Pancreas: No pancreatic mass or inflammatory focus. Spleen: No splenic lesions are evident. Adrenals/Urinary Tract: Adrenals appear normal bilaterally. Kidneys bilaterally show no mass or hydronephrosis on either side. There is no renal or ureteral calculus on either side. The urinary bladder is partially  decompressed. Urinary bladder wall thickness is felt to be within normal limits for degree of urinary bladder distention. Stomach/Bowel: There are multiple sigmoid diverticula without appreciable diverticulitis. There is no apparent bowel wall or mesenteric thickening. There is no bowel obstruction. No free air or portal venous air. Vascular/Lymphatic: There is no abdominal aortic aneurysm. No vascular lesions are evident on this noncontrast enhanced study. There is no adenopathy by size criteria in the abdomen or pelvis. There are scattered subcentimeter lymph nodes in the mesenteric. Reproductive: The prostate and seminal vesicles appear unremarkable. No pelvic mass or pelvic fluid collection. Other: The appendix appears normal. There is no ascites or abscess in the abdomen or pelvis. Fat is noted in each inguinal ring. Musculoskeletal: There is postoperative change with lower lumbar spine. There is anterolisthesis of L5 on S1 with marked disc space narrowing at this level. There are pars defects at L5 bilaterally, stable. There is degenerative  change in the lower thoracic region, stable. There are no blastic or lytic bone lesions. There is no intramuscular lesion or abdominal wall lesion. IMPRESSION: No bowel wall thickening or bowel obstruction. There are multiple sigmoid diverticula without diverticulitis. No abscess. Appendix appears normal. No renal or ureteral calculus.  No hydronephrosis. Postoperative change in the lumbar spine with spondylolisthesis at L5-S1, stable. Scattered subcentimeter mesenteric lymph nodes are regarded as nonspecific. No adenopathy by size criteria. Electronically Signed   By: Bretta BangWilliam  Woodruff III M.D.   On: 11/25/2015 10:13    Procedures Procedures (including critical care time)  Medications Ordered in ED Medications  sodium chloride 0.9 % bolus 1,000 mL (0 mLs Intravenous Stopped 11/25/15 1015)  ketorolac (TORADOL) 30 MG/ML injection 30 mg (30 mg Intravenous Given 11/25/15 0915)  ondansetron (ZOFRAN) injection 4 mg (4 mg Intravenous Given 11/25/15 0914)  HYDROmorphone (DILAUDID) injection 1 mg (1 mg Intravenous Given 11/25/15 1026)     Initial Impression / Assessment and Plan / ED Course  I have reviewed the triage vital signs and the nursing notes.  Pertinent labs & imaging results that were available during my care of the patient were reviewed by me and considered in my medical decision making (see chart for details).  Clinical Course   30 year old male presents with abdominal pain most consistent with viral illness. Patient is afebrile, not tachycardic or tachypneic, normotensive, and not hypoxic. CBC is unremarkable. CMP remarkable for slighly low CO2 and glucose of 114. UA shows amber colored urine. CT renal is overall unremarkable. Will d/c with pain control and antiemetics. Patient is NAD, non-toxic, with stable VS. Patient is informed of clinical course, understands medical decision making process, and agrees with plan. Opportunity for questions provided and all questions answered. Return  precautions given.   Final Clinical Impressions(s) / ED Diagnoses   Final diagnoses:  Nausea vomiting and diarrhea    New Prescriptions Discharge Medication List as of 11/25/2015 11:25 AM    START taking these medications   Details  dicyclomine (BENTYL) 20 MG tablet Take 1 tablet (20 mg total) by mouth 2 (two) times daily., Starting Wed 11/25/2015, Print    HYDROcodone-acetaminophen (NORCO/VICODIN) 5-325 MG tablet Take 2 tablets by mouth every 4 (four) hours as needed., Starting Wed 11/25/2015, Print    ondansetron (ZOFRAN) 4 MG tablet Take 1 tablet (4 mg total) by mouth every 6 (six) hours., Starting Wed 11/25/2015, Print         Bethel BornKelly Marie Annie Saephan, PA-C 11/27/15 [id number]    Gerhard Munchobert Lockwood, MD 11/28/15 [phone]

## 2015-11-25 NOTE — ED Notes (Signed)
Urinal placed at bedside. Patient encouraged to void when able.

## 2015-11-25 NOTE — ED Triage Notes (Signed)
Pt c/o L upper and lower abdominal pain x 2 days with N/V/D. Pt was scoped a few months ago and was told he had polyps. Pt also c/o dark urine and "pressure" when urinating. A&Ox4 and ambulatory.

## 2015-11-25 NOTE — ED Notes (Signed)
PA at bedside.

## 2015-11-25 NOTE — ED Notes (Signed)
Patient transported to CT 

## 2015-11-25 NOTE — ED Notes (Signed)
Discharge instructions, follow up care, and rx x3 reviewed with patient. Patient verbalized understanding. 

## 2015-12-09 ENCOUNTER — Encounter (HOSPITAL_COMMUNITY): Payer: Self-pay | Admitting: Emergency Medicine

## 2015-12-09 ENCOUNTER — Emergency Department (HOSPITAL_COMMUNITY): Payer: PRIVATE HEALTH INSURANCE

## 2015-12-09 ENCOUNTER — Emergency Department (HOSPITAL_COMMUNITY)
Admission: EM | Admit: 2015-12-09 | Discharge: 2015-12-09 | Disposition: A | Discharge status: Home / Self Care | Payer: PRIVATE HEALTH INSURANCE | Attending: Emergency Medicine | Admitting: Emergency Medicine

## 2015-12-09 DIAGNOSIS — Z791 Long term (current) use of non-steroidal anti-inflammatories (NSAID): Secondary | ICD-10-CM | POA: Insufficient documentation

## 2015-12-09 DIAGNOSIS — F909 Attention-deficit hyperactivity disorder, unspecified type: Secondary | ICD-10-CM | POA: Insufficient documentation

## 2015-12-09 DIAGNOSIS — F17210 Nicotine dependence, cigarettes, uncomplicated: Secondary | ICD-10-CM | POA: Diagnosis not present

## 2015-12-09 DIAGNOSIS — Z79899 Other long term (current) drug therapy: Secondary | ICD-10-CM | POA: Insufficient documentation

## 2015-12-09 DIAGNOSIS — M546 Pain in thoracic spine: Secondary | ICD-10-CM | POA: Diagnosis not present

## 2015-12-09 DIAGNOSIS — M549 Dorsalgia, unspecified: Secondary | ICD-10-CM | POA: Diagnosis present

## 2015-12-09 DIAGNOSIS — M5489 Other dorsalgia: Secondary | ICD-10-CM

## 2015-12-09 MED ORDER — OXYCODONE-ACETAMINOPHEN 5-325 MG PO TABS: 1.0000 | ORAL_TABLET | ORAL | 0 refills | Status: DC | PRN

## 2015-12-09 MED ORDER — DIAZEPAM 5 MG PO TABS
5.0000 mg | ORAL_TABLET | Freq: Once | ORAL | Status: AC
  Administered 2015-12-09: 5 mg via ORAL
  Filled 2015-12-09: qty 1

## 2015-12-09 MED ORDER — HYDROMORPHONE HCL 1 MG/ML IJ SOLN
1.0000 mg | Freq: Once | INTRAMUSCULAR | Status: AC
  Administered 2015-12-09: 1 mg via INTRAMUSCULAR
  Filled 2015-12-09: qty 1

## 2015-12-09 MED ORDER — DIAZEPAM 5 MG PO TABS: 5.0000 mg | ORAL_TABLET | Freq: Two times a day (BID) | ORAL | 0 refills | Status: DC

## 2015-12-09 MED ORDER — OXYCODONE-ACETAMINOPHEN 5-325 MG PO TABS
1.0000 | ORAL_TABLET | Freq: Once | ORAL | Status: AC
  Administered 2015-12-09: 1 via ORAL
  Filled 2015-12-09: qty 1

## 2015-12-09 NOTE — ED Triage Notes (Signed)
Patient c/o mid lower spine pain that started yesterday. Patient states that he has had fusion in back before but even resting and taking muscle relaxers it isnt helping with the pain.
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
## 2015-12-09 NOTE — ED Notes (Signed)
PA at bedside.

## 2015-12-09 NOTE — Discharge Instructions (Signed)
Take your medications as prescribed and for pain relief. I also recommend applying ice and/or heat to her back for 15-20 minutes 3-4 times daily for pain relief. Refrain from doing any heavy lifting, squatting or repetitive movements that exacerbate your symptoms for the next week. Follow-up with your primary care provider in the next week if your symptoms have not improved. Please return to the Emergency Department if symptoms worsen or new onset of fever, numbness, tingling, groin anesthesia, loss of bowel or bladder, weakness, abdominal pain, urinary retention.

## 2016-01-12 ENCOUNTER — Encounter (HOSPITAL_COMMUNITY): Payer: Self-pay | Admitting: Emergency Medicine

## 2016-01-12 DIAGNOSIS — F17210 Nicotine dependence, cigarettes, uncomplicated: Secondary | ICD-10-CM | POA: Diagnosis not present

## 2016-01-12 DIAGNOSIS — F909 Attention-deficit hyperactivity disorder, unspecified type: Secondary | ICD-10-CM | POA: Insufficient documentation

## 2016-01-12 DIAGNOSIS — M109 Gout, unspecified: Secondary | ICD-10-CM | POA: Diagnosis not present

## 2016-01-12 DIAGNOSIS — M25522 Pain in left elbow: Secondary | ICD-10-CM | POA: Diagnosis present

## 2016-01-12 NOTE — ED Triage Notes (Signed)
Pt states that he started having L elbow swelling yesterday that has progressively gotten worse. States his doctor at his work gave him a shot of rocephin today. Alert and oriented.

## 2016-01-13 ENCOUNTER — Other Ambulatory Visit: Payer: Self-pay | Admitting: Internal Medicine

## 2016-01-13 ENCOUNTER — Emergency Department (HOSPITAL_COMMUNITY)
Admission: EM | Admit: 2016-01-13 | Discharge: 2016-01-13 | Disposition: A | Discharge status: Home / Self Care | Payer: PRIVATE HEALTH INSURANCE | Attending: Emergency Medicine | Admitting: Emergency Medicine

## 2016-01-13 DIAGNOSIS — M109 Gout, unspecified: Secondary | ICD-10-CM

## 2016-01-13 DIAGNOSIS — G40409 Other generalized epilepsy and epileptic syndromes, not intractable, without status epilepticus: Secondary | ICD-10-CM

## 2016-01-13 DIAGNOSIS — G40309 Generalized idiopathic epilepsy and epileptic syndromes, not intractable, without status epilepticus: Secondary | ICD-10-CM

## 2016-01-13 LAB — CBC WITH DIFFERENTIAL/PLATELET
BASOS ABS: 0.1 10*3/uL (ref 0.0–0.1)
Basophils Relative: 1 %
Eosinophils Absolute: 0.1 10*3/uL (ref 0.0–0.7)
Eosinophils Relative: 1 %
HEMATOCRIT: 44.7 % (ref 39.0–52.0)
Hemoglobin: 15.6 g/dL (ref 13.0–17.0)
LYMPHS PCT: 40 %
Lymphs Abs: 4.3 10*3/uL — ABNORMAL HIGH (ref 0.7–4.0)
MCH: 29.8 pg (ref 26.0–34.0)
MCHC: 34.9 g/dL (ref 30.0–36.0)
MCV: 85.5 fL (ref 78.0–100.0)
MONO ABS: 1 10*3/uL (ref 0.1–1.0)
MONOS PCT: 10 %
NEUTROS ABS: 5.2 10*3/uL (ref 1.7–7.7)
Neutrophils Relative %: 48 %
Platelets: 245 10*3/uL (ref 150–400)
RBC: 5.23 MIL/uL (ref 4.22–5.81)
RDW: 13.6 % (ref 11.5–15.5)
WBC: 10.7 10*3/uL — ABNORMAL HIGH (ref 4.0–10.5)

## 2016-01-13 LAB — BASIC METABOLIC PANEL
ANION GAP: 8 (ref 5–15)
BUN: 15 mg/dL (ref 6–20)
CO2: 22 mmol/L (ref 22–32)
Calcium: 8.9 mg/dL (ref 8.9–10.3)
Chloride: 110 mmol/L (ref 101–111)
Creatinine, Ser: 0.79 mg/dL (ref 0.61–1.24)
GFR calc Af Amer: >60 mL/min (ref >60)
GFR calc non Af Amer: >60 mL/min (ref >60)
GLUCOSE: 102 mg/dL — AB (ref 65–99)
POTASSIUM: 3.8 mmol/L (ref 3.5–5.1)
Sodium: 140 mmol/L (ref 135–145)

## 2016-01-13 LAB — URIC ACID: Uric Acid, Serum: 10 mg/dL — ABNORMAL HIGH (ref 4.4–7.6)

## 2016-01-13 LAB — I-STAT CG4 LACTIC ACID, ED: Lactic Acid, Venous: 0.96 mmol/L (ref 0.5–1.9)

## 2016-01-13 MED ORDER — CLINDAMYCIN PHOSPHATE 600 MG/50ML IV SOLN
600.0000 mg | Freq: Once | INTRAVENOUS | Status: AC
  Administered 2016-01-13: 600 mg via INTRAVENOUS
  Filled 2016-01-13: qty 50

## 2016-01-13 MED ORDER — METHYLPREDNISOLONE SODIUM SUCC 125 MG IJ SOLR
125.0000 mg | Freq: Once | INTRAMUSCULAR | Status: AC
  Administered 2016-01-13: 125 mg via INTRAVENOUS
  Filled 2016-01-13: qty 2

## 2016-01-13 MED ORDER — SODIUM CHLORIDE 0.9 % IV BOLUS (SEPSIS)
500.0000 mL | Freq: Once | INTRAVENOUS | Status: AC
  Administered 2016-01-13: 500 mL via INTRAVENOUS

## 2016-01-13 MED ORDER — ONDANSETRON HCL 4 MG/2ML IJ SOLN
4.0000 mg | Freq: Once | INTRAMUSCULAR | Status: AC
  Administered 2016-01-13: 4 mg via INTRAVENOUS
  Filled 2016-01-13: qty 2

## 2016-01-13 MED ORDER — FENTANYL CITRATE (PF) 100 MCG/2ML IJ SOLN
100.0000 ug | Freq: Once | INTRAMUSCULAR | Status: AC
  Administered 2016-01-13: 100 ug via INTRAVENOUS
  Filled 2016-01-13: qty 2

## 2016-01-13 MED ORDER — HYDROCODONE-ACETAMINOPHEN 5-325 MG PO TABS: 1.0000 | ORAL_TABLET | ORAL | 0 refills | Status: DC | PRN

## 2016-01-13 MED ORDER — PREDNISONE 10 MG PO TABS: 20.0000 mg | ORAL_TABLET | Freq: Two times a day (BID) | ORAL | 0 refills | Status: DC

## 2016-01-13 NOTE — ED Provider Notes (Signed)
[facility]-EMERGENCY DEPT Provider Note   CSN: [id number] Arrival date & time: 01/12/16  2342     History   Chief Complaint Chief Complaint  Patient presents with  . Elbow Pain    HPI Christopher Gilbert is a 30 y.o. male.  He presents for evaluation of pain and redness, left elbow, which started about 22 hours ago, spontaneously. No known trauma. He was treated briefly by a physician yesterday, while at work, with an injection of Rocephin. He has had similar pain previously when he had gout. He was concerned this evening because he developed shaking chills. He denies nausea, vomiting, weakness, dizziness, chest or back pain. There are no other known modifying factors.  HPI  Past Medical History:  Diagnosis Date  . Arthritis    gout in right big toe  . Attention deficit hyperactivity disorder   . Epilepsy (HCC)   . GERD (gastroesophageal reflux disease)   . Gout   . Headache(784.0)    migraines after seizure  . Seizures (HCC)    grand mal, haven't had a seizure in "a long time"    Patient Active Problem List   Diagnosis Date Noted  . Gouty arthritis 08/14/2014  . Epilepsy, grand mal (HCC) 02/11/2014  . Status epilepticus (HCC) 11/23/2013  . Lactic acidosis 11/23/2013  . Gout 11/23/2013  . Spondylolisthesis of lumbar region 04/05/2013    Past Surgical History:  Procedure Laterality Date  . BACK SURGERY    . COLONOSCOPY         Home Medications    Prior to Admission medications   Medication Sig Start Date End Date Taking? Authorizing Provider  albuterol (PROAIR HFA) 108 (90 Base) MCG/ACT inhaler Inhale 2 puffs into the lungs every 6 (six) hours as needed for wheezing or shortness of breath.  04/02/15  Yes Historical Provider, MD  allopurinol (ZYLOPRIM) 100 MG tablet Take 2 tablets (200 mg total) by mouth daily. 02/27/15  Yes Barbaraann BarthelJames O Breen, MD  amphetamine-dextroamphetamine (ADDERALL XR) 10 MG 24 hr capsule Take 10 mg by mouth daily. 10/19/15 01/17/16 Yes  Historical Provider, MD  diazepam (VALIUM) 5 MG tablet Take 1 tablet (5 mg total) by mouth 2 (two) times daily. 12/09/15  Yes Barrett HenleNicole Elizabeth Nadeau, PA-C  divalproex (DEPAKOTE) 500 MG DR tablet Take 1 tablet by mouth twice daily, with a 250mg  tablet. Total dose 750mg  twice daily 02/26/15  Yes Barbaraann BarthelJames O Breen, MD  fluticasone [facility](FLONASE) 50 MCG/ACT nasal spray Place 2 sprays into the nose daily as needed for allergies.  04/01/15  Yes Historical Provider, MD  topiramate (TOPAMAX) 100 MG tablet Take 1 tablet (100 mg total) by mouth 2 (two) times daily. 03/23/15  Yes Quentin Angstlugbemiga E Jegede, MD  cyclobenzaprine (FLEXERIL) 10 MG tablet Take 1 tablet (10 mg total) by mouth 2 (two) times daily as needed for muscle spasms. Patient not taking: Reported on 01/13/2016 02/26/15   Barbaraann BarthelJames O Breen, MD  dicyclomine (BENTYL) 20 MG tablet Take 1 tablet (20 mg total) by mouth 2 (two) times daily. Patient not taking: Reported on 01/13/2016 11/25/15   Bethel BornKelly Marie Gekas, PA-C  HYDROcodone-acetaminophen (NORCO/VICODIN) 5-325 MG tablet Take 2 tablets by mouth every 4 (four) hours as needed. Patient not taking: Reported on 01/13/2016 11/25/15   Bethel BornKelly Marie Gekas, PA-C  ondansetron (ZOFRAN) 4 MG tablet Take 1 tablet (4 mg total) by mouth every 6 (six) hours. Patient not taking: Reported on 01/13/2016 11/25/15   Bethel BornKelly Marie Gekas, PA-C  oxyCODONE-acetaminophen (PERCOCET/ROXICET) 5-325 MG tablet Take  1 tablet by mouth every 4 (four) hours as needed for severe pain. Patient not taking: Reported on 01/13/2016 12/09/15   Barrett Henle, PA-C    Family History Family History  Problem Relation Age of Onset  . Diabetes Paternal Grandmother     Social History Social History  Substance Use Topics  . Smoking status: Current Every Day Smoker    Types: Cigarettes  . Smokeless tobacco: Never Used  . Alcohol use Yes     Comment: occasional/socially     Allergies   Review of patient's allergies indicates no known  allergies.   Review of Systems Review of Systems  All other systems reviewed and are negative.    Physical Exam Updated Vital Signs BP (!) 151/112 (BP Location: Left Arm) Comment: RN aware  Pulse 97   Temp 98.1 F (36.7 C) (Oral)   Resp 22   Ht 5\' 11"  (1.803 m)   Wt 255 lb (115.7 kg)   SpO2 100%   BMI 35.57 kg/m   Physical Exam  Constitutional: He is oriented to person, place, and time. He appears well-developed and well-nourished.  HENT:  Head: Normocephalic and atraumatic.  Right Ear: External ear normal.  Left Ear: External ear normal.  Eyes: Conjunctivae and EOM are normal. Pupils are equal, round, and reactive to light.  Neck: Normal range of motion and phonation normal. Neck supple.  Cardiovascular: Normal rate, regular rhythm and normal heart sounds.   Pulmonary/Chest: Effort normal and breath sounds normal. He exhibits no bony tenderness.  Abdominal: Soft. There is no tenderness.  Musculoskeletal:  Decreased range of motion left elbow secondary to pain. Redness, left posterior elbow, flat, without associated induration, drainage or proximal streaking.  Neurological: He is alert and oriented to person, place, and time. No cranial nerve deficit or sensory deficit. He exhibits normal muscle tone. Coordination normal.  Skin: Skin is warm, dry and intact.  Psychiatric: He has a normal mood and affect. His behavior is normal. Judgment and thought content normal.  Nursing note and vitals reviewed.    ED Treatments / Results  Labs (all labs ordered are listed, but only abnormal results are displayed) Labs Reviewed  BASIC METABOLIC PANEL - Abnormal; Notable for the following:       Result Value   Glucose, Bld 102 (*)    All other components within normal limits  CBC WITH DIFFERENTIAL/PLATELET - Abnormal; Notable for the following:    WBC 10.7 (*)    Lymphs Abs 4.3 (*)    All other components within normal limits  URIC ACID - Abnormal; Notable for the following:     Uric Acid, Serum 10.0 (*)    All other components within normal limits  I-STAT CG4 LACTIC ACID, ED    EKG  EKG Interpretation None       Radiology No results found.  Procedures Procedures (including critical care time)  Medications Ordered in ED Medications  sodium chloride 0.9 % bolus 500 mL (500 mLs Intravenous New Bag/Given 01/13/16 0545)  clindamycin (CLEOCIN) IVPB 600 mg (600 mg Intravenous New Bag/Given 01/13/16 0546)  methylPREDNISolone sodium succinate (SOLU-MEDROL) 125 mg/2 mL injection 125 mg (125 mg Intravenous Given 01/13/16 0546)  fentaNYL (SUBLIMAZE) injection 100 mcg (100 mcg Intravenous Given 01/13/16 0546)  ondansetron (ZOFRAN) injection 4 mg (4 mg Intravenous Given 01/13/16 0546)     Initial Impression / Assessment and Plan / ED Course  I have reviewed the triage vital signs and the nursing notes.  Pertinent labs &  imaging results that were available during my care of the patient were reviewed by me and considered in my medical decision making (see chart for details).  Clinical Course    Medications  sodium chloride 0.9 % bolus 500 mL (500 mLs Intravenous New Bag/Given 01/13/16 0545)  clindamycin (CLEOCIN) IVPB 600 mg (600 mg Intravenous New Bag/Given 01/13/16 0546)  methylPREDNISolone sodium succinate (SOLU-MEDROL) 125 mg/2 mL injection 125 mg (125 mg Intravenous Given 01/13/16 0546)  fentaNYL (SUBLIMAZE) injection 100 mcg (100 mcg Intravenous Given 01/13/16 0546)  ondansetron (ZOFRAN) injection 4 mg (4 mg Intravenous Given 01/13/16 0546)    Patient Vitals for the past 24 hrs:  BP Temp Temp src Pulse Resp SpO2 Height Weight  01/13/16 0321 (!) 151/112 98.1 F (36.7 C) Oral 97 22 100 % - -  01/12/16 2356 (!) 149/103 98.5 F (36.9 C) Oral 101 18 99 % 5\' 11"  (1.803 m) 255 lb (115.7 kg)    7:15 AM Reevaluation with update and discussion. After initial assessment and treatment, an updated evaluation reveals Patient is more comfortable now and feels like there  is less redness in the elbow. The elbow does look less red to me as well. Findings discussed with patient and all questions answered. Asherah Lavoy L    Final Clinical Impressions(s) / ED Diagnoses   Final diagnoses:  Acute gout of left elbow, unspecified cause    Left elbow pain, swelling and redness is consistent with acute gout attack. Patient improved with initial treatment. Doubt serious bacterial infection, metabolic instability or impending vascular collapse.   Nursing Notes Reviewed/ Care Coordinated Applicable Imaging Reviewed Interpretation of Laboratory Data incorporated into ED treatment  The patient appears reasonably screened and/or stabilized for discharge and I doubt any other medical condition or other [facility] requiring further screening, evaluation, or treatment in the ED at this time prior to discharge.  Plan: Home Medications- continue; Home Treatments- rest, heat,elevation; return here if the recommended treatment, does not improve the symptoms; Recommended follow up- PCP check up 5-7 days, and prn    New Prescriptions New Prescriptions   No medications on file     Mancel Bale, MD 01/13/16 [phone]

## 2016-01-13 NOTE — Discharge Instructions (Signed)
Use heat on the sore area of your left elbow, 3 or 4 times a day. Keep your left arm elevated above your heart, as much as possible.

## 2016-01-20 ENCOUNTER — Encounter (HOSPITAL_COMMUNITY): Payer: Self-pay | Admitting: *Deleted

## 2016-01-20 ENCOUNTER — Emergency Department (HOSPITAL_COMMUNITY)
Admission: EM | Admit: 2016-01-20 | Discharge: 2016-01-20 | Disposition: A | Discharge status: Home / Self Care | Payer: PRIVATE HEALTH INSURANCE | Attending: Emergency Medicine | Admitting: Emergency Medicine

## 2016-01-20 ENCOUNTER — Emergency Department (HOSPITAL_COMMUNITY): Payer: PRIVATE HEALTH INSURANCE

## 2016-01-20 DIAGNOSIS — F909 Attention-deficit hyperactivity disorder, unspecified type: Secondary | ICD-10-CM | POA: Insufficient documentation

## 2016-01-20 DIAGNOSIS — F17210 Nicotine dependence, cigarettes, uncomplicated: Secondary | ICD-10-CM | POA: Diagnosis not present

## 2016-01-20 DIAGNOSIS — R079 Chest pain, unspecified: Secondary | ICD-10-CM | POA: Diagnosis present

## 2016-01-20 DIAGNOSIS — R0789 Other chest pain: Secondary | ICD-10-CM | POA: Insufficient documentation

## 2016-01-20 LAB — BASIC METABOLIC PANEL
ANION GAP: 10 (ref 5–15)
BUN: 13 mg/dL (ref 6–20)
CHLORIDE: 108 mmol/L (ref 101–111)
CO2: 22 mmol/L (ref 22–32)
CREATININE: 1 mg/dL (ref 0.61–1.24)
Calcium: 9.2 mg/dL (ref 8.9–10.3)
GFR calc non Af Amer: >60 mL/min (ref >60)
Glucose, Bld: 93 mg/dL (ref 65–99)
POTASSIUM: 4 mmol/L (ref 3.5–5.1)
SODIUM: 140 mmol/L (ref 135–145)

## 2016-01-20 LAB — CBC WITH DIFFERENTIAL/PLATELET
BASOS PCT: 2 %
Basophils Absolute: 0.2 10*3/uL — ABNORMAL HIGH (ref 0.0–0.1)
Eosinophils Absolute: 0.1 10*3/uL (ref 0.0–0.7)
Eosinophils Relative: 1 %
HEMATOCRIT: 48.9 % (ref 39.0–52.0)
HEMOGLOBIN: 16.9 g/dL (ref 13.0–17.0)
LYMPHS ABS: 3.5 10*3/uL (ref 0.7–4.0)
Lymphocytes Relative: 28 %
MCH: 29.4 pg (ref 26.0–34.0)
MCHC: 34.6 g/dL (ref 30.0–36.0)
MCV: 85 fL (ref 78.0–100.0)
MONOS PCT: 10 %
Monocytes Absolute: 1.2 10*3/uL — ABNORMAL HIGH (ref 0.1–1.0)
Neutro Abs: 7.3 10*3/uL (ref 1.7–7.7)
Neutrophils Relative %: 59 %
Platelets: 272 10*3/uL (ref 150–400)
RBC: 5.75 MIL/uL (ref 4.22–5.81)
RDW: 13.6 % (ref 11.5–15.5)
WBC: 12.2 10*3/uL — AB (ref 4.0–10.5)

## 2016-01-20 LAB — HEPATIC FUNCTION PANEL
ALBUMIN: 3.9 g/dL (ref 3.5–5.0)
ALK PHOS: 53 U/L (ref 38–126)
ALT: 73 U/L — AB (ref 17–63)
AST: 26 U/L (ref 15–41)
BILIRUBIN INDIRECT: 0.9 mg/dL (ref 0.3–0.9)
Bilirubin, Direct: 0.1 mg/dL (ref 0.1–0.5)
TOTAL PROTEIN: 6.9 g/dL (ref 6.5–8.1)
Total Bilirubin: 1 mg/dL (ref 0.3–1.2)

## 2016-01-20 LAB — URINALYSIS, ROUTINE W REFLEX MICROSCOPIC
Bilirubin Urine: NEGATIVE
Glucose, UA: NEGATIVE mg/dL
Hgb urine dipstick: NEGATIVE
Ketones, ur: NEGATIVE mg/dL
LEUKOCYTES UA: NEGATIVE
NITRITE: NEGATIVE
PROTEIN: NEGATIVE mg/dL
SPECIFIC GRAVITY, URINE: 1.022 (ref 1.005–1.030)
pH: 5.5 (ref 5.0–8.0)

## 2016-01-20 LAB — LIPASE, BLOOD: LIPASE: 25 U/L (ref 11–51)

## 2016-01-20 LAB — I-STAT TROPONIN, ED: Troponin i, poc: 0 ng/mL (ref 0.00–0.08)

## 2016-01-20 MED ORDER — MORPHINE SULFATE (PF) 4 MG/ML IV SOLN
4.0000 mg | INTRAVENOUS | Status: DC | PRN
  Administered 2016-01-20: 4 mg via INTRAVENOUS
  Filled 2016-01-20: qty 1

## 2016-01-20 MED ORDER — ONDANSETRON HCL 4 MG/2ML IJ SOLN
4.0000 mg | Freq: Once | INTRAMUSCULAR | Status: AC
  Administered 2016-01-20: 4 mg via INTRAVENOUS
  Filled 2016-01-20: qty 2

## 2016-01-20 MED ORDER — HYDROCODONE-ACETAMINOPHEN 5-325 MG PO TABS: 1.0000 | ORAL_TABLET | ORAL | 0 refills | Status: DC | PRN

## 2016-01-20 MED ORDER — NAPROXEN 500 MG PO TABS: 500.0000 mg | ORAL_TABLET | Freq: Two times a day (BID) | ORAL | 0 refills | Status: DC

## 2016-01-20 MED ORDER — KETOROLAC TROMETHAMINE 30 MG/ML IJ SOLN
30.0000 mg | Freq: Once | INTRAMUSCULAR | Status: AC
  Administered 2016-01-20: 30 mg via INTRAVENOUS
  Filled 2016-01-20: qty 1

## 2016-01-20 NOTE — ED Notes (Signed)
Pt is aware urine is needed, urinal at bedside.  

## 2016-01-20 NOTE — ED Notes (Signed)
Pt is in stable condition upon d/c and ambulates from ED. 

## 2016-01-20 NOTE — ED Provider Notes (Signed)
[facility]-EMERGENCY DEPT Provider Note   CSN: [id number] Arrival date & time: 01/20/16  0935     History   Chief Complaint Chief Complaint  Patient presents with  . Chest Pain    HPI Christopher Gilbert is a 30 y.o. male.  He was evaluation of left-sided chest pain. He states he awakened at 8:15 this morning. When he was getting up. He felt sudden sharp left sided chest pain. Transferred by EMS or calling 911. Given nitroglycerin glycerin aspirin really had no significant change. His only sniffing past medical history is a history of seizure disorder, and gout. Treated recent gout flare. No history of hypertension diabetes high cholesterol. No risk factors for PE. No recent costovertebral difficulty breathing.  C/o CP EMS Ntg ASA  8:15 getting rest, pleuretic sharp  HPI  Past Medical History:  Diagnosis Date  . Arthritis    gout in right big toe  . Attention deficit hyperactivity disorder   . Epilepsy (HCC)   . GERD (gastroesophageal reflux disease)   . Gout   . Headache(784.0)    migraines after seizure  . Seizures (HCC)    grand mal, haven't had a seizure in "a long time"    Patient Active Problem List   Diagnosis Date Noted  . Gouty arthritis 08/14/2014  . Epilepsy, grand mal (HCC) 02/11/2014  . Status epilepticus (HCC) 11/23/2013  . Lactic acidosis 11/23/2013  . Gout 11/23/2013  . Spondylolisthesis of lumbar region 04/05/2013    Past Surgical History:  Procedure Laterality Date  . BACK SURGERY    . COLONOSCOPY         Home Medications    Prior to Admission medications   Medication Sig Start Date End Date Taking? Authorizing Provider  albuterol (PROAIR HFA) 108 (90 Base) MCG/ACT inhaler Inhale 2 puffs into the lungs every 6 (six) hours as needed for wheezing or shortness of breath.  04/02/15  Yes Historical Provider, MD  allopurinol (ZYLOPRIM) 100 MG tablet Take 2 tablets (200 mg total) by mouth daily. 02/27/15  Yes Barbaraann Barthel, MD  diazepam (VALIUM)  5 MG tablet Take 1 tablet (5 mg total) by mouth 2 (two) times daily. 12/09/15  Yes Barrett Henle, PA-C  divalproex (DEPAKOTE) 500 MG DR tablet Take 1 tablet by mouth twice daily, with a 250mg  tablet. Total dose 750mg  twice daily 02/26/15  Yes Barbaraann Barthel, MD  fluticasone [facility]) 50 MCG/ACT nasal spray Place 2 sprays into the nose daily as needed for allergies.  04/01/15  Yes Historical Provider, MD  predniSONE (DELTASONE) 10 MG tablet Take 2 tablets (20 mg total) by mouth 2 (two) times daily. 01/13/16  Yes Mancel Bale, MD  sulfamethoxazole-trimethoprim (BACTRIM DS,SEPTRA DS) 800-160 MG tablet Take 1 tablet by mouth 2 (two) times daily. Started on 01-13-16 for 7 days.   Yes Historical Provider, MD  topiramate (TOPAMAX) 100 MG tablet TAKE ONE TABLET BY MOUTH TWICE DAILY 01/14/16  Yes Quentin Angst, MD  cyclobenzaprine (FLEXERIL) 10 MG tablet Take 1 tablet (10 mg total) by mouth 2 (two) times daily as needed for muscle spasms. Patient not taking: Reported on 01/20/2016 02/26/15   Barbaraann Barthel, MD  dicyclomine (BENTYL) 20 MG tablet Take 1 tablet (20 mg total) by mouth 2 (two) times daily. Patient not taking: Reported on 01/20/2016 11/25/15   Bethel Born, PA-C  HYDROcodone-acetaminophen (NORCO/VICODIN) 5-325 MG tablet Take 1 tablet by mouth every 4 (four) hours as needed. 01/20/16   Rolland Porter, MD  naproxen (NAPROSYN) 500 MG tablet Take 1 tablet (500 mg total) by mouth 2 (two) times daily. 01/20/16   Rolland Porter, MD  ondansetron (ZOFRAN) 4 MG tablet Take 1 tablet (4 mg total) by mouth every 6 (six) hours. Patient not taking: Reported on 01/20/2016 11/25/15   Bethel Born, PA-C    Family History Family History  Problem Relation Age of Onset  . Diabetes Paternal Grandmother     Social History Social History  Substance Use Topics  . Smoking status: Current Every Day Smoker    Types: Cigarettes  . Smokeless tobacco: Never Used  . Alcohol use Yes     Comment:  occasional/socially     Allergies   Review of patient's allergies indicates no known allergies.   Review of Systems Review of Systems  Constitutional: Negative for appetite change, chills, diaphoresis, fatigue and fever.  HENT: Negative for mouth sores, sore throat and trouble swallowing.   Eyes: Negative for visual disturbance.  Respiratory: Negative for cough, chest tightness, shortness of breath and wheezing.   Cardiovascular: Positive for chest pain.  Gastrointestinal: Negative for abdominal distention, abdominal pain, diarrhea, nausea and vomiting.  Endocrine: Negative for polydipsia, polyphagia and polyuria.  Genitourinary: Negative for dysuria, frequency and hematuria.  Musculoskeletal: Negative for gait problem.  Skin: Negative for color change, pallor and rash.  Neurological: Negative for dizziness, syncope, light-headedness and headaches.  Hematological: Does not bruise/bleed easily.  Psychiatric/Behavioral: Negative for behavioral problems and confusion.     Physical Exam Updated Vital Signs BP 110/86   Pulse 63   Temp 97.5 F (36.4 C) (Oral)   Resp 21   Ht 5\' 11"  (1.803 m)   Wt 257 lb (116.6 kg)   SpO2 97%   BMI 35.84 kg/m   Physical Exam  Constitutional: He is oriented to person, place, and time. He appears well-developed and well-nourished. No distress.  HENT:  Head: Normocephalic.  Eyes: Conjunctivae are normal. Pupils are equal, round, and reactive to light. No scleral icterus.  Neck: Normal range of motion. Neck supple. No thyromegaly present.  Cardiovascular: Normal rate and regular rhythm.  Exam reveals no gallop and no friction rub.   No murmur heard. Pulmonary/Chest: Effort normal and breath sounds normal. No respiratory distress. He has no wheezes. He has no rales.  Tennis and left chest. Pain with any movement of the shoulder arm pectoralis chest wall pain with breathing no hypersensitivity to skin rash or vesicles. Clear bilateral breath sounds.   Abdominal: Soft. Bowel sounds are normal. He exhibits no distension. There is no tenderness. There is no rebound.  Musculoskeletal: Normal range of motion.  Neurological: He is alert and oriented to person, place, and time.  Skin: Skin is warm and dry. No rash noted.  Psychiatric: He has a normal mood and affect. His behavior is normal.     ED Treatments / Results  Labs (all labs ordered are listed, but only abnormal results are displayed) Labs Reviewed  CBC WITH DIFFERENTIAL/PLATELET - Abnormal; Notable for the following:       Result Value   WBC 12.2 (*)    Monocytes Absolute 1.2 (*)    Basophils Absolute 0.2 (*)    All other components within normal limits  URINALYSIS, ROUTINE W REFLEX MICROSCOPIC (NOT AT [facility]) - Abnormal; Notable for the following:    Color, Urine AMBER (*)    All other components within normal limits  HEPATIC FUNCTION PANEL - Abnormal; Notable for the following:    ALT 73 (*)  All other components within normal limits  BASIC METABOLIC PANEL  LIPASE, BLOOD  I-STAT TROPOININ, ED    EKG  EKG Interpretation None       Radiology Dg Chest 2 View  Result Date: 01/20/2016 CLINICAL DATA:  Left-sided chest pain and shortness of breath for 2 hours. EXAM: CHEST  2 VIEW COMPARISON:  05/07/2015. FINDINGS: Trachea is midline. Heart size stable. Lungs are somewhat low in volume but clear. Question healed lower left rib fractures. No pleural fluid. IMPRESSION: No acute findings. Electronically Signed   By: Leanna BattlesMelinda  Blietz M.D.   On: 01/20/2016 10:25    Procedures Procedures (including critical care time)  Medications Ordered in ED Medications  morphine 4 MG/ML injection 4 mg (4 mg Intravenous Given 01/20/16 1124)  ondansetron (ZOFRAN) injection 4 mg (4 mg Intravenous Given 01/20/16 1124)  ketorolac (TORADOL) 30 MG/ML injection 30 mg (30 mg Intravenous Given 01/20/16 1124)     Initial Impression / Assessment and Plan / ED Course  I have reviewed the  triage vital signs and the nursing notes.  Pertinent labs & imaging results that were available during my care of the patient were reviewed by me and considered in my medical decision making (see chart for details).  Clinical Course    Negative evaluation per normal chest x-ray. Normal EKG without acute or ischemic changes. No initial or delta troponin change his permanent discharge home plan attempt at worst pain medicines primary care follow-up.  Final Clinical Impressions(s) / ED Diagnoses   Final diagnoses:  Chest wall pain    New Prescriptions Discharge Medication List as of 01/20/2016  2:21 PM    START taking these medications   Details  naproxen (NAPROSYN) 500 MG tablet Take 1 tablet (500 mg total) by mouth 2 (two) times daily., Starting Wed 01/20/2016, Print         Rolland PorterMark Sharlett Lienemann, MD 01/20/16 [phone]

## 2016-01-20 NOTE — ED Triage Notes (Signed)
Pt arrives from home with c/o sudden onset of left sided CP that worsens with inspiration and movement. Pt has no cardiac hx. Also c/o diaphoresis and nausea.

## 2017-12-06 ENCOUNTER — Emergency Department (HOSPITAL_COMMUNITY)
Admission: EM | Admit: 2017-12-06 | Discharge: 2017-12-06 | Disposition: A | Discharge status: Home / Self Care | Payer: 59 | Attending: Emergency Medicine | Admitting: Emergency Medicine

## 2017-12-06 ENCOUNTER — Encounter (HOSPITAL_COMMUNITY): Payer: Self-pay | Admitting: *Deleted

## 2017-12-06 DIAGNOSIS — M25572 Pain in left ankle and joints of left foot: Secondary | ICD-10-CM | POA: Diagnosis present

## 2017-12-06 DIAGNOSIS — M109 Gout, unspecified: Secondary | ICD-10-CM | POA: Diagnosis not present

## 2017-12-06 DIAGNOSIS — Z79899 Other long term (current) drug therapy: Secondary | ICD-10-CM | POA: Insufficient documentation

## 2017-12-06 DIAGNOSIS — F17210 Nicotine dependence, cigarettes, uncomplicated: Secondary | ICD-10-CM | POA: Insufficient documentation

## 2017-12-06 HISTORY — DX: Obesity, unspecified: E66.9

## 2017-12-06 MED ORDER — PREDNISONE 20 MG PO TABS
60.0000 mg | ORAL_TABLET | Freq: Once | ORAL | Status: AC
  Administered 2017-12-06: 60 mg via ORAL
  Filled 2017-12-06: qty 3

## 2017-12-06 MED ORDER — ALLOPURINOL 100 MG PO TABS
100.0000 mg | ORAL_TABLET | Freq: Every day | ORAL | Status: AC
  Administered 2017-12-06: 100 mg via ORAL
  Filled 2017-12-06: qty 1

## 2017-12-06 MED ORDER — ALLOPURINOL 100 MG PO TABS: 100.0000 mg | ORAL_TABLET | Freq: Every day | ORAL | 0 refills | Status: DC

## 2017-12-06 MED ORDER — HYDROCODONE-ACETAMINOPHEN 5-325 MG PO TABS
1.0000 | ORAL_TABLET | Freq: Once | ORAL | Status: AC
  Administered 2017-12-06: 1 via ORAL
  Filled 2017-12-06: qty 1

## 2017-12-06 MED ORDER — COLCHICINE 0.6 MG PO TABS: 0.6000 mg | ORAL_TABLET | Freq: Every day | ORAL | 0 refills | Status: DC

## 2017-12-06 MED ORDER — PREDNISONE 10 MG PO TABS: 40.0000 mg | ORAL_TABLET | Freq: Every day | ORAL | 0 refills | Status: AC

## 2017-12-06 NOTE — ED Provider Notes (Signed)
[facility] EMERGENCY DEPARTMENT Provider Note   CSN: [id number] Arrival date & time: 12/06/17  5784     History   Chief Complaint Chief Complaint  Patient presents with  . Gout    HPI Christopher Gilbert is a 32 y.o. male.  32 year old male presents with complaint of gout flare in his left ankle.  Patient has a history of gout and same location previously, states that he woke up today with pain but was out of his allopurinol and other gout medications.  Patient went to work and states while ambulating on his ankle he developed severe pain in the ankle that made him feel nauseous.  Patient works as a Engineer, civil (consulting), checked his blood pressure and his blood pressure was elevated which prompted his visit to the emergency room.  Patient states that if he is not bearing weight, is resting his leg he has minimal pain and otherwise feels okay however if he begins to ambulate around the pain causes him to feel nauseous.  Denies injury to the ankle or foot.  States feels similar to previous gout flares.  Recently started the keto diet and is wondering if his dietary changes have caused the gout to flare today.  No other complaints or concerns.     Past Medical History:  Diagnosis Date  . Arthritis    gout in right big toe  . Attention deficit hyperactivity disorder   . Epilepsy (HCC)   . GERD (gastroesophageal reflux disease)   . Gout   . Headache(784.0)    migraines after seizure  . Obesity   . Seizures (HCC)    grand mal, haven't had a seizure in "a long time"    Patient Active Problem List   Diagnosis Date Noted  . Gouty arthritis 08/14/2014  . Epilepsy, grand mal (HCC) 02/11/2014  . Status epilepticus (HCC) 11/23/2013  . Lactic acidosis 11/23/2013  . Gout 11/23/2013  . Spondylolisthesis of lumbar region 04/05/2013    Past Surgical History:  Procedure Laterality Date  . BACK SURGERY    . COLONOSCOPY          Home Medications    Prior to Admission  medications   Medication Sig Start Date End Date Taking? Authorizing Provider  allopurinol (ZYLOPRIM) 100 MG tablet Take 1 tablet (100 mg total) by mouth daily for 10 days. 12/06/17 12/16/17  Jeannie Fend, PA-C  colchicine 0.6 MG tablet Take 1 tablet (0.6 mg total) by mouth daily for 10 doses. 12/06/17 12/16/17  Jeannie Fend, PA-C  diazepam (VALIUM) 5 MG tablet Take 1 tablet (5 mg total) by mouth 2 (two) times daily. 12/09/15   Barrett Henle, PA-C  divalproex (DEPAKOTE) 500 MG DR tablet Take 1 tablet by mouth twice daily, with a 250mg  tablet. Total dose 750mg  twice daily 02/26/15   Barbaraann Barthel, MD  predniSONE (DELTASONE) 10 MG tablet Take 4 tablets (40 mg total) by mouth daily for 4 days. 12/06/17 12/10/17  Jeannie Fend, PA-C  topiramate (TOPAMAX) 100 MG tablet TAKE ONE TABLET BY MOUTH TWICE DAILY 01/14/16   Quentin Angst, MD    Family History Family History  Problem Relation Age of Onset  . Diabetes Paternal Grandmother     Social History Social History   Tobacco Use  . Smoking status: Current Every Day Smoker    Types: Cigarettes  . Smokeless tobacco: Never Used  Substance Use Topics  . Alcohol use: Yes    Comment: occasional/socially  .  Drug use: No     Allergies   Patient has no known allergies.   Review of Systems Review of Systems  Constitutional: Negative for chills and fever.  Respiratory: Negative for shortness of breath.   Cardiovascular: Negative for chest pain.  Gastrointestinal: Positive for nausea. Negative for abdominal pain and vomiting.  Musculoskeletal: Positive for arthralgias, gait problem, joint swelling and myalgias.  Skin: Negative for color change, rash and wound.  Allergic/Immunologic: Negative for immunocompromised state.  Neurological: Negative for weakness and numbness.  All other systems reviewed and are negative.    Physical Exam Updated Vital Signs BP 140/85 (BP Location: Left Arm)   Pulse (!) 105   Temp 98 F (36.7  C) (Oral)   Resp 16   Ht 6' (1.829 m)   Wt 122.5 kg   SpO2 96%   BMI 36.62 kg/m   Physical Exam  Constitutional: He is oriented to person, place, and time. He appears well-developed and well-nourished. No distress.  HENT:  Head: Normocephalic and atraumatic.  Cardiovascular: Intact distal pulses.  Pulmonary/Chest: Effort normal.  Musculoskeletal: He exhibits tenderness. He exhibits no edema or deformity.       Left ankle: He exhibits decreased range of motion and swelling. He exhibits no ecchymosis, no deformity, no laceration and normal pulse. Tenderness. Lateral malleolus and medial malleolus tenderness found.       Feet:  Neurological: He is alert and oriented to person, place, and time.  Skin: Skin is warm and dry. He is not diaphoretic. No erythema.  Psychiatric: He has a normal mood and affect. His behavior is normal.  Nursing note and vitals reviewed.    ED Treatments / Results  Labs (all labs ordered are listed, but only abnormal results are displayed) Labs Reviewed - No data to display  EKG None  Radiology No results found.  Procedures Procedures (including critical care time)  Medications Ordered in ED Medications  allopurinol (ZYLOPRIM) tablet 100 mg (has no administration in time range)  HYDROcodone-acetaminophen (NORCO/VICODIN) 5-325 MG per tablet 1 tablet (1 tablet Oral Given 12/06/17 1040)  predniSONE (DELTASONE) tablet 60 mg (60 mg Oral Given 12/06/17 1040)     Initial Impression / Assessment and Plan / ED Course  I have reviewed the triage vital signs and the nursing notes.  Pertinent labs & imaging results that were available during my care of the patient were reviewed by me and considered in my medical decision making (see chart for details).  Clinical Course as of Dec 07 1051  Wed Dec 06, 2017  1051 32 yo male with history of gout presents with gout flare left ankle. Patient was walking around at work triaging patients when his pain became so  severe he felt nauseous and unwell. These symptoms have resolved while resting in the ER today. On exam, left ankle with mild swelling, TTP, limited ROM secondary to pain, no history of injury. Suspect recent dietary changes may have contributed to gout flare today. Patient's regular gout meds prescribed- prednisone, colchicine, allopurinol. Given Norco for pain while in the Er, discussed dietary changes, follow up with PCP, return to ER as needed.   [LM]    Clinical Course User Index [LM] Jeannie FendMurphy, Laura A, PA-C    Final Clinical Impressions(s) / ED Diagnoses   Final diagnoses:  Acute gout of left ankle, unspecified cause    ED Discharge Orders         Ordered    allopurinol (ZYLOPRIM) 100 MG tablet  Daily  12/06/17 1034    predniSONE (DELTASONE) 10 MG tablet  Daily     12/06/17 1034    colchicine 0.6 MG tablet  Daily     12/06/17 1034           Alden Hipp 12/06/17 1053    Cathren Laine, MD 12/07/17 1052

## 2017-12-06 NOTE — ED Triage Notes (Signed)
Pt is here for left foot pain.  Pt thinks that it is a flare up of gout.  Pt states that he has been out of his allopurinol.  Pt states that the pain was severe and made him nauseated.  Pt reports that they took his BP at work and found that he was hypertensive, pt attributes this to the pain he was in.  Pt is alert and oriented at this time.  No CP with this.

## 2018-04-14 ENCOUNTER — Emergency Department (HOSPITAL_BASED_OUTPATIENT_CLINIC_OR_DEPARTMENT_OTHER)
Admission: EM | Admit: 2018-04-14 | Discharge: 2018-04-14 | Disposition: A | Discharge status: Home / Self Care | Payer: 59 | Attending: Emergency Medicine | Admitting: Emergency Medicine

## 2018-04-14 ENCOUNTER — Encounter (HOSPITAL_BASED_OUTPATIENT_CLINIC_OR_DEPARTMENT_OTHER): Payer: Self-pay | Admitting: Emergency Medicine

## 2018-04-14 ENCOUNTER — Other Ambulatory Visit: Payer: Self-pay

## 2018-04-14 DIAGNOSIS — M25571 Pain in right ankle and joints of right foot: Secondary | ICD-10-CM | POA: Diagnosis present

## 2018-04-14 DIAGNOSIS — F17210 Nicotine dependence, cigarettes, uncomplicated: Secondary | ICD-10-CM | POA: Insufficient documentation

## 2018-04-14 DIAGNOSIS — M25471 Effusion, right ankle: Secondary | ICD-10-CM | POA: Diagnosis not present

## 2018-04-14 DIAGNOSIS — Z79899 Other long term (current) drug therapy: Secondary | ICD-10-CM | POA: Diagnosis not present

## 2018-04-14 DIAGNOSIS — M79671 Pain in right foot: Secondary | ICD-10-CM

## 2018-04-14 LAB — CBC WITH DIFFERENTIAL/PLATELET
ABS IMMATURE GRANULOCYTES: 0.22 10*3/uL — AB (ref 0.00–0.07)
BASOS PCT: 1 %
Basophils Absolute: 0.1 10*3/uL (ref 0.0–0.1)
EOS ABS: 0.1 10*3/uL (ref 0.0–0.5)
Eosinophils Relative: 1 %
HEMATOCRIT: 48.6 % (ref 39.0–52.0)
Hemoglobin: 15.9 g/dL (ref 13.0–17.0)
Immature Granulocytes: 2 %
LYMPHS ABS: 3.1 10*3/uL (ref 0.7–4.0)
Lymphocytes Relative: 22 %
MCH: 28.2 pg (ref 26.0–34.0)
MCHC: 32.7 g/dL (ref 30.0–36.0)
MCV: 86.3 fL (ref 80.0–100.0)
MONOS PCT: 9 %
Monocytes Absolute: 1.2 10*3/uL — ABNORMAL HIGH (ref 0.1–1.0)
NEUTROS ABS: 9.2 10*3/uL — AB (ref 1.7–7.7)
NEUTROS PCT: 65 %
Platelets: 270 10*3/uL (ref 150–400)
RBC: 5.63 MIL/uL (ref 4.22–5.81)
RDW: 13 % (ref 11.5–15.5)
WBC: 13.9 10*3/uL — AB (ref 4.0–10.5)
nRBC: 0 % (ref 0.0–0.2)

## 2018-04-14 LAB — BASIC METABOLIC PANEL
Anion gap: 7 (ref 5–15)
BUN: 12 mg/dL (ref 6–20)
CHLORIDE: 106 mmol/L (ref 98–111)
CO2: 24 mmol/L (ref 22–32)
Calcium: 9.2 mg/dL (ref 8.9–10.3)
Creatinine, Ser: 0.92 mg/dL (ref 0.61–1.24)
GFR calc Af Amer: >60 mL/min (ref >60)
GFR calc non Af Amer: >60 mL/min (ref >60)
GLUCOSE: 98 mg/dL (ref 70–99)
Potassium: 3.8 mmol/L (ref 3.5–5.1)
Sodium: 137 mmol/L (ref 135–145)

## 2018-04-14 LAB — GRAM STAIN: Special Requests: NORMAL

## 2018-04-14 LAB — SYNOVIAL CELL COUNT + DIFF, W/ CRYSTALS
Crystals, Fluid: NONE SEEN
Eosinophils-Synovial: 0 % (ref 0–1)
Lymphocytes-Synovial Fld: 3 % (ref 0–20)
Monocyte-Macrophage-Synovial Fluid: 42 % — ABNORMAL LOW (ref 50–90)
Neutrophil, Synovial: 55 % — ABNORMAL HIGH (ref 0–25)
WBC, Synovial: 51 /mm3 (ref 0–200)

## 2018-04-14 LAB — SEDIMENTATION RATE: Sed Rate: 3 mm/hr (ref 0–16)

## 2018-04-14 LAB — C-REACTIVE PROTEIN: CRP: 2.8 mg/dL — ABNORMAL HIGH (ref <1.0)

## 2018-04-14 MED ORDER — ONDANSETRON 4 MG PO TBDP: ORAL_TABLET | ORAL | 0 refills | Status: DC

## 2018-04-14 MED ORDER — MORPHINE SULFATE 15 MG PO TABS: 15.0000 mg | ORAL_TABLET | ORAL | 0 refills | Status: DC | PRN

## 2018-04-14 MED ORDER — ONDANSETRON 4 MG PO TBDP
4.0000 mg | ORAL_TABLET | Freq: Once | ORAL | Status: AC
  Administered 2018-04-14: 4 mg via ORAL
  Filled 2018-04-14: qty 1

## 2018-04-14 MED ORDER — LIDOCAINE-EPINEPHRINE 1 %-1:100000 IJ SOLN
20.0000 mL | Freq: Once | INTRAMUSCULAR | Status: AC
  Administered 2018-04-14: 1 mL via INTRADERMAL
  Filled 2018-04-14: qty 1

## 2018-04-14 MED ORDER — DIAZEPAM 5 MG PO TABS
5.0000 mg | ORAL_TABLET | Freq: Once | ORAL | Status: AC
  Administered 2018-04-14: 5 mg via ORAL
  Filled 2018-04-14: qty 1

## 2018-04-14 MED ORDER — OXYCODONE-ACETAMINOPHEN 5-325 MG PO TABS: 1.0000 | ORAL_TABLET | Freq: Four times a day (QID) | ORAL | 0 refills | Status: DC | PRN

## 2018-04-14 MED ORDER — KETOROLAC TROMETHAMINE 15 MG/ML IJ SOLN
15.0000 mg | Freq: Once | INTRAMUSCULAR | Status: AC
  Administered 2018-04-14: 15 mg via INTRAMUSCULAR
  Filled 2018-04-14: qty 1

## 2018-04-14 MED ORDER — PREDNISONE 50 MG PO TABS
60.0000 mg | ORAL_TABLET | Freq: Once | ORAL | Status: AC
  Administered 2018-04-14: 60 mg via ORAL
  Filled 2018-04-14: qty 1

## 2018-04-14 MED ORDER — PREDNISONE 20 MG PO TABS: ORAL_TABLET | ORAL | 0 refills | Status: DC

## 2018-04-14 MED ORDER — OXYCODONE-ACETAMINOPHEN 5-325 MG PO TABS
2.0000 | ORAL_TABLET | Freq: Once | ORAL | Status: AC
  Administered 2018-04-14: 2 via ORAL
  Filled 2018-04-14: qty 2

## 2018-04-14 MED ORDER — OXYCODONE HCL 5 MG PO TABS
5.0000 mg | ORAL_TABLET | Freq: Once | ORAL | Status: AC
  Administered 2018-04-14: 5 mg via ORAL
  Filled 2018-04-14: qty 1

## 2018-04-14 MED ORDER — ONDANSETRON HCL 4 MG/2ML IJ SOLN
INTRAMUSCULAR | Status: AC
  Administered 2018-04-14: 4 mg
  Filled 2018-04-14: qty 2

## 2018-04-14 MED ORDER — COLCHICINE 0.6 MG PO TABS
1.2000 mg | ORAL_TABLET | Freq: Once | ORAL | Status: AC
  Administered 2018-04-14: 1.2 mg via ORAL
  Filled 2018-04-14: qty 2

## 2018-04-14 MED ORDER — COLCHICINE 0.6 MG PO TABS
0.6000 mg | ORAL_TABLET | Freq: Once | ORAL | Status: AC
  Administered 2018-04-14: 0.6 mg via ORAL
  Filled 2018-04-14: qty 1

## 2018-04-14 MED ORDER — ACETAMINOPHEN 500 MG PO TABS
1000.0000 mg | ORAL_TABLET | Freq: Once | ORAL | Status: AC
  Administered 2018-04-14: 1000 mg via ORAL
  Filled 2018-04-14: qty 2

## 2018-04-14 NOTE — ED Provider Notes (Signed)
I assumed care from Dr. Adela Lank to review results of synovial fluid and for definitive management.  Patient is alert and appropriate.  We reviewed his history of present illness.  Patient does not have apparent risk factors for septic joint.  No IV drug abuse.  Patient is otherwise healthy.  He reports he did incidentally "roll his ankle" over a week ago.  He reports he just did not think that was contributory because he did not have much pain and it had not bothered him until starting yesterday and then severe pain today.  Patient has mild effusion of the right ankle.  No swelling or edema of the lower leg or calf.  Patient endorses pain at the toes with forced extension.  Patient is neurovascularly intact.  Synovial fluid does not have crystals present.  White cells are only 51.   Consult: Reviewed with Dr. Aundria Rud orthopedics.  He advises synovial fluid does not show any signs of infection.  More consistent with inflammatory reaction.  Some concern for possible autoimmune arthropathy with elevated monocytes.  Recommends a trial of prednisone and pain medications with a follow-up at rheumatology.  Patient is counseled on findings and consultation with orthopedics.  He is advised to take a course of prednisone and Percocet sparingly until pain is controlled.  He has given a cam walker and crutches for comfort.  Counseled to follow-up with his PCP ASAP to review his response to treatment and make referrals as needed.   Arby Barrette, MD 04/14/18 1816

## 2018-04-14 NOTE — ED Notes (Addendum)
Pt c/o right foot and right hand pain since yesterday upon waking. Pt denies injury. Pt also c/o nausea and states he feels like he needs to vomit. Pt requested emesis bag. Pt denies taking any medications for pain today. Pt griamces and verbalizes pain when palpating right foot, states that he "cant bear for his foot to be touched"

## 2018-04-14 NOTE — ED Notes (Signed)
ED Provider at bedside. 

## 2018-04-14 NOTE — ED Provider Notes (Signed)
[facility] EMERGENCY DEPARTMENT Provider Note   CSN: [id number] Arrival date & time: 04/14/18  1127     History   Chief Complaint Chief Complaint  Patient presents with  . Foot Pain  . Hand Pain  . Elbow Pain    HPI Christopher Gilbert is a 33 y.o. male.  33 yo M with a chief complaint of right ankle pain.  Going on for the past couple days.  Described as severe.  Has had some nausea and vomiting due to the pain.  Denies fevers or chills.  Has difficulty walking due to it.  Started having pain in his right lateral hand as well as his right elbow.  Denies trauma.  Has a documented history of gout but he has never had a history of arthrocentesis.  The history is provided by the patient and the spouse.  Foot Pain  This is a new problem. The current episode started 2 days ago. The problem occurs constantly. The problem has been gradually worsening. Pertinent negatives include no chest pain, no abdominal pain, no headaches and no shortness of breath. The symptoms are aggravated by bending and twisting. Nothing relieves the symptoms. He has tried nothing for the symptoms. The treatment provided no relief.  Hand Pain  Pertinent negatives include no chest pain, no abdominal pain, no headaches and no shortness of breath.    Past Medical History:  Diagnosis Date  . Arthritis    gout in right big toe  . Attention deficit hyperactivity disorder   . Epilepsy (HCC)   . GERD (gastroesophageal reflux disease)   . Gout   . Headache(784.0)    migraines after seizure  . Obesity   . Seizures (HCC)    grand mal, haven't had a seizure in "a long time"    Patient Active Problem List   Diagnosis Date Noted  . Gouty arthritis 08/14/2014  . Epilepsy, grand mal (HCC) 02/11/2014  . Status epilepticus (HCC) 11/23/2013  . Lactic acidosis 11/23/2013  . Gout 11/23/2013  . Spondylolisthesis of lumbar region 04/05/2013    Past Surgical History:  Procedure Laterality Date  . BACK  SURGERY    . COLONOSCOPY          Home Medications    Prior to Admission medications   Medication Sig Start Date End Date Taking? Authorizing Provider  divalproex (DEPAKOTE) 500 MG DR tablet Take 1 tablet by mouth twice daily, with a 250mg  tablet. Total dose 750mg  twice daily 02/26/15  Yes Barbaraann Barthel, MD  topiramate (TOPAMAX) 100 MG tablet TAKE ONE TABLET BY MOUTH TWICE DAILY 01/14/16  Yes Quentin Angst, MD  allopurinol (ZYLOPRIM) 100 MG tablet Take 1 tablet (100 mg total) by mouth daily for 10 days. 12/06/17 12/16/17  Jeannie Fend, PA-C  colchicine 0.6 MG tablet Take 1 tablet (0.6 mg total) by mouth daily for 10 doses. 12/06/17 12/16/17  Jeannie Fend, PA-C  diazepam (VALIUM) 5 MG tablet Take 1 tablet (5 mg total) by mouth 2 (two) times daily. 12/09/15   Barrett Henle, PA-C  oxyCODONE-acetaminophen (PERCOCET) 5-325 MG tablet Take 1-2 tablets by mouth every 6 (six) hours as needed for severe pain. 04/14/18   Arby Barrette, MD  predniSONE (DELTASONE) 20 MG tablet 3 tabs po day one, then 2 tabs daily x 4 days 04/14/18   Arby Barrette, MD    Family History Family History  Problem Relation Age of Onset  . Diabetes Paternal Grandmother     Social  History Social History   Tobacco Use  . Smoking status: Current Every Day Smoker    Types: Cigarettes  . Smokeless tobacco: Never Used  Substance Use Topics  . Alcohol use: Yes    Comment: occasional/socially  . Drug use: No     Allergies   Patient has no known allergies.   Review of Systems Review of Systems  Constitutional: Negative for chills and fever.  HENT: Negative for congestion and facial swelling.   Eyes: Negative for discharge and visual disturbance.  Respiratory: Negative for shortness of breath.   Cardiovascular: Negative for chest pain and palpitations.  Gastrointestinal: Negative for abdominal pain, diarrhea and vomiting.  Musculoskeletal: Positive for arthralgias. Negative for myalgias.    Skin: Negative for color change and rash.  Neurological: Negative for tremors, syncope and headaches.  Psychiatric/Behavioral: Negative for confusion and dysphoric mood.     Physical Exam Updated Vital Signs BP (!) 146/95 (BP Location: Right Arm)   Pulse 90   Temp 98.2 F (36.8 C) (Oral)   Resp 18   Ht 6' (1.829 m)   Wt 124.7 kg   SpO2 100%   BMI 37.30 kg/m   Physical Exam Vitals signs and nursing note reviewed.  Constitutional:      Appearance: He is well-developed.  HENT:     Head: Normocephalic and atraumatic.  Eyes:     Pupils: Pupils are equal, round, and reactive to light.  Neck:     Musculoskeletal: Normal range of motion and neck supple.     Vascular: No JVD.  Cardiovascular:     Rate and Rhythm: Normal rate and regular rhythm.     Heart sounds: No murmur. No friction rub. No gallop.   Pulmonary:     Effort: No respiratory distress.     Breath sounds: No wheezing.  Abdominal:     General: There is no distension.     Tenderness: There is no guarding or rebound.  Musculoskeletal: Normal range of motion.        General: Swelling and tenderness present.     Comments: Edema to the right ankle  Skin:    Coloration: Skin is not pale.     Findings: No rash.  Neurological:     Mental Status: He is alert and oriented to person, place, and time.  Psychiatric:        Behavior: Behavior normal.      ED Treatments / Results  Labs (all labs ordered are listed, but only abnormal results are displayed) Labs Reviewed  CBC WITH DIFFERENTIAL/PLATELET - Abnormal; Notable for the following components:      Result Value   WBC 13.9 (*)    Neutro Abs 9.2 (*)    Monocytes Absolute 1.2 (*)    Abs Immature Granulocytes 0.22 (*)    All other components within normal limits  C-REACTIVE PROTEIN - Abnormal; Notable for the following components:   CRP 2.8 (*)    All other components within normal limits  SYNOVIAL CELL COUNT + DIFF, W/ CRYSTALS - Abnormal; Notable for the  following components:   Color, Synovial AMBER (*)    Appearance-Synovial TURBID (*)    Neutrophil, Synovial 55 (*)    Monocyte-Macrophage-Synovial Fluid 42 (*)    All other components within normal limits  GRAM STAIN  BODY FLUID CULTURE  BASIC METABOLIC PANEL  SEDIMENTATION RATE  GLUCOSE, BODY FLUID OTHER  PROTEIN, BODY FLUID (OTHER)    EKG None  Radiology No results found.  Procedures .Joint  Aspiration/Arthrocentesis Date/Time: 04/14/2018 1:53 PM Performed by: Melene PlanFloyd, Ausha Sieh, DO Authorized by: Melene PlanFloyd, Hessie Varone, DO   Consent:    Consent obtained:  Verbal   Consent given by:  Patient   Risks discussed:  Bleeding, infection, incomplete drainage and nerve damage   Alternatives discussed:  No treatment, delayed treatment, alternative treatment and observation Location:    Location:  Ankle   Ankle:  R ankle Anesthesia (see MAR for exact dosages):    Anesthesia method:  Local infiltration   Local anesthetic:  Lidocaine 1% WITH epi Procedure details:    Preparation: Patient was prepped and draped in usual sterile fashion     Needle gauge: 21G.   Ultrasound guidance: no     Approach:  Medial   Aspirate amount:  4   Aspirate characteristics:  Clear and serous   Steroid injected: no     Specimen collected: yes   Post-procedure details:    Dressing:  Adhesive bandage   Patient tolerance of procedure:  Tolerated well, no immediate complications   (including critical care time)  Medications Ordered in ED Medications  acetaminophen (TYLENOL) tablet 1,000 mg (1,000 mg Oral Given 04/14/18 1313)  ketorolac (TORADOL) 15 MG/ML injection 15 mg (15 mg Intramuscular Given 04/14/18 1321)  oxyCODONE (Oxy IR/ROXICODONE) immediate release tablet 5 mg (5 mg Oral Given 04/14/18 1324)  diazepam (VALIUM) tablet 5 mg (5 mg Oral Given 04/14/18 1324)  colchicine tablet 1.2 mg (1.2 mg Oral Given 04/14/18 1323)  lidocaine-EPINEPHrine (XYLOCAINE W/EPI) 1 %-1:100000 (with pres) injection 20 mL (1 mL  Intradermal Given 04/14/18 1350)  ondansetron (ZOFRAN) 4 MG/2ML injection (4 mg  Given 04/14/18 1323)  colchicine tablet 0.6 mg (0.6 mg Oral Given 04/14/18 1500)  oxyCODONE-acetaminophen (PERCOCET/ROXICET) 5-325 MG per tablet 2 tablet (2 tablets Oral Given 04/14/18 1850)  predniSONE (DELTASONE) tablet 60 mg (60 mg Oral Given 04/14/18 1850)  ondansetron (ZOFRAN-ODT) disintegrating tablet 4 mg (4 mg Oral Given 04/14/18 1818)     Initial Impression / Assessment and Plan / ED Course  I have reviewed the triage vital signs and the nursing notes.  Pertinent labs & imaging results that were available during my care of the patient were reviewed by me and considered in my medical decision making (see chart for details).     33 yo M with a chief complaint of right foot and ankle pain.  Going on for the past few days.  Has a documented history of gout but is never been tested.  I discussed with him risks and benefits of arthrocentesis and he like to have that performed.  We will give pain medicine.  Reassess.  Feeling better on reassessment.  Arthrocentesis with clear serous fluid.   Signed out.   Please see Dr. Karie GeorgesPfeiffers note for further details of care.   The patients results and plan were reviewed and discussed.   Any x-rays performed were independently reviewed by myself.   Differential diagnosis were considered with the presenting HPI.  Medications  acetaminophen (TYLENOL) tablet 1,000 mg (1,000 mg Oral Given 04/14/18 1313)  ketorolac (TORADOL) 15 MG/ML injection 15 mg (15 mg Intramuscular Given 04/14/18 1321)  oxyCODONE (Oxy IR/ROXICODONE) immediate release tablet 5 mg (5 mg Oral Given 04/14/18 1324)  diazepam (VALIUM) tablet 5 mg (5 mg Oral Given 04/14/18 1324)  colchicine tablet 1.2 mg (1.2 mg Oral Given 04/14/18 1323)  lidocaine-EPINEPHrine (XYLOCAINE W/EPI) 1 %-1:100000 (with pres) injection 20 mL (1 mL Intradermal Given 04/14/18 1350)  ondansetron (ZOFRAN) 4 MG/2ML injection (4 mg  Given 04/14/18 1323)    colchicine tablet 0.6 mg (0.6 mg Oral Given 04/14/18 1500)  oxyCODONE-acetaminophen (PERCOCET/ROXICET) 5-325 MG per tablet 2 tablet (2 tablets Oral Given 04/14/18 1850)  predniSONE (DELTASONE) tablet 60 mg (60 mg Oral Given 04/14/18 1850)  ondansetron (ZOFRAN-ODT) disintegrating tablet 4 mg (4 mg Oral Given 04/14/18 1818)    Vitals:   04/14/18 1143 04/14/18 1351 04/14/18 1601 04/14/18 1854  BP: (!) 129/92 (!) 143/94 (!) 144/85 (!) 146/95  Pulse: (!) 108 91 92 90  Resp: 16 20 (!) 22 18  Temp: 98.2 F (36.8 C)     TempSrc: Oral     SpO2: 98% 100% 98% 100%  Weight: 124.7 kg     Height: 6' (1.829 m)       Final diagnoses:  Ankle effusion, right  Right foot pain      Final Clinical Impressions(s) / ED Diagnoses   Final diagnoses:  Ankle effusion, right  Right foot pain    ED Discharge Orders         Ordered    morphine (MSIR) 15 MG tablet  Every 4 hours PRN,   Status:  Discontinued     04/14/18 1611    ondansetron (ZOFRAN ODT) 4 MG disintegrating tablet  Status:  Discontinued     04/14/18 1611    predniSONE (DELTASONE) 20 MG tablet     04/14/18 1802    oxyCODONE-acetaminophen (PERCOCET) 5-325 MG tablet  Every 6 hours PRN     04/14/18 1802           Melene PlanFloyd, Odysseus Cada, DO 04/15/18 0701

## 2018-04-14 NOTE — Discharge Instructions (Addendum)
1.  Take prednisone as prescribed, finish all the prescription.  Take Tylenol 1000 mg for pain every 6 hours as needed.  If this is not adequate for pain control, you may take 1-2 Percocet every 6 hours until your pain is improving.DO NOT TAKE PERCOCET AND TYLENOL TOGETHER, THEY BOTH CONTAIN ACETAMINOPHEN. EXCESSIVE DOSES OF ACETAMINOPHEN CAN CAUSE LIVER DAMAGE.  Once your pain is improving, use only regular Tylenol.  It is important to discontinue narcotic pain medications (percocet) as soon as possible to avoid problems with addiction and constipation. 2.  Try to elevate and ice your ankle as much as possible.  When you need to be up and walking, wear the cam walker and use crutches if needed. 3.  Make an appointment to follow-up with your family doctor as soon as possible.  Have them review your results from the emergency department.  You may need referral to a rheumatologist for autoimmune causes of joint pain and swelling. 4.  Return to the emergency department if your symptoms are worsening, you are developing fevers or other concerning symptoms.

## 2018-04-14 NOTE — ED Triage Notes (Signed)
Pt here with right foot pain, bilateral hand pain and left elbow pain since yesterday. Good peripheral pulses throughout in triage with no visible deformities.

## 2018-04-14 NOTE — ED Notes (Signed)
Pt enrolled in aromatherapy pain trial 

## 2018-04-15 LAB — GLUCOSE, BODY FLUID OTHER: Glucose, Body Fluid Other: 100 mg/dL

## 2018-04-17 LAB — BODY FLUID CULTURE
CULTURE: NO GROWTH
Gram Stain: NONE SEEN
Special Requests: NORMAL

## 2018-04-17 LAB — PROTEIN, BODY FLUID (OTHER): Total Protein, Body Fluid Other: 4.2 g/dL

## 2018-12-27 ENCOUNTER — Encounter (HOSPITAL_BASED_OUTPATIENT_CLINIC_OR_DEPARTMENT_OTHER): Payer: Self-pay | Admitting: Adult Health

## 2018-12-27 ENCOUNTER — Other Ambulatory Visit: Payer: Self-pay

## 2018-12-27 ENCOUNTER — Emergency Department (HOSPITAL_BASED_OUTPATIENT_CLINIC_OR_DEPARTMENT_OTHER)
Admission: EM | Admit: 2018-12-27 | Discharge: 2018-12-27 | Disposition: A | Discharge status: Home / Self Care | Payer: 59 | Attending: Emergency Medicine | Admitting: Emergency Medicine

## 2018-12-27 DIAGNOSIS — F17210 Nicotine dependence, cigarettes, uncomplicated: Secondary | ICD-10-CM | POA: Insufficient documentation

## 2018-12-27 DIAGNOSIS — G40909 Epilepsy, unspecified, not intractable, without status epilepticus: Secondary | ICD-10-CM | POA: Insufficient documentation

## 2018-12-27 DIAGNOSIS — Z79899 Other long term (current) drug therapy: Secondary | ICD-10-CM | POA: Insufficient documentation

## 2018-12-27 DIAGNOSIS — M79671 Pain in right foot: Secondary | ICD-10-CM | POA: Insufficient documentation

## 2018-12-27 MED ORDER — OXYCODONE-ACETAMINOPHEN 5-325 MG PO TABS: 2.0000 | ORAL_TABLET | Freq: Four times a day (QID) | ORAL | 0 refills | Status: DC | PRN

## 2018-12-27 MED ORDER — PREDNISONE 50 MG PO TABS
60.0000 mg | ORAL_TABLET | Freq: Once | ORAL | Status: AC
  Administered 2018-12-27: 60 mg via ORAL
  Filled 2018-12-27: qty 1

## 2018-12-27 MED ORDER — PREDNISONE 10 MG (21) PO TBPK: ORAL_TABLET | Freq: Every day | ORAL | 0 refills | Status: DC

## 2018-12-27 MED ORDER — OXYCODONE-ACETAMINOPHEN 5-325 MG PO TABS
1.0000 | ORAL_TABLET | Freq: Once | ORAL | Status: AC
  Administered 2018-12-27: 12:00:00 1 via ORAL
  Filled 2018-12-27: qty 1

## 2018-12-27 MED FILL — predniSONE 10 MG TABS: 10 | 12 days supply | Qty: 42 | Fill #0

## 2018-12-27 MED FILL — OXYCODONE-ACETAMINOPHEN 5-3: 5-325 | 2 days supply | Qty: 10 | Fill #0

## 2018-12-27 NOTE — Discharge Instructions (Addendum)
You were seen in the emergency department for 6 days of pain in your right foot especially in your right great toe.  This is likely gout and we are treating you with a course of prednisone.  We are also prescribing you a short course of some pain medication.  Please continue anti-inflammatories like Advil or Aleve.  Elevate.  Watch for signs of fever or worsening redness.  Please consider getting an appointment with rheumatology as you have had other joint issues before.  Return to the emergency department if any concerning symptoms.

## 2018-12-27 NOTE — ED Triage Notes (Signed)
Christopher Gilbert presents with right great toe pain taht radiates into to the medial area of his forefoot and up his ankle. He has a hx of gouty arthritis and also has had an infection in the right great toe previously. He has been out of his allopurinol for a few days. He describes the pain as severe. And he is having trouble ambulating due to the pain. HE tried motrin 3 hours ago aqnd has not had relief.

## 2018-12-27 NOTE — ED Provider Notes (Signed)
[facility] EMERGENCY DEPARTMENT Provider Note   CSN: [id number] Arrival date & time: 12/27/18  1111     History   Chief Complaint Chief Complaint  Patient presents with  . Foot Pain    HPI Christopher Gilbert is a 33 y.o. male.  He has a prior history of gout.  He is complaining of 6 days of right great toe pain that radiates up his foot into his ankle when he ambulates.  He rates the pain is severe and worse with any kind of weightbearing.  No known trauma.  No fevers.  Using Motrin without relief.  He says he was here at the beginning of the year for similar pain in his toe and his ankle and had his ankle tapped that did not show any sign of infection.     The history is provided by the patient.  Foot Pain This is a new problem. Episode onset: 6 days. The problem occurs constantly. The problem has not changed since onset.Pertinent negatives include no chest pain, no abdominal pain, no headaches and no shortness of breath. The symptoms are aggravated by standing and walking. Nothing relieves the symptoms. He has tried nothing for the symptoms. The treatment provided no relief.    Past Medical History:  Diagnosis Date  . Arthritis    gout in right big toe  . Attention deficit hyperactivity disorder   . Epilepsy ([place])   . GERD (gastroesophageal reflux disease)   . Gout   . Headache(784.0)    migraines after seizure  . Obesity   . Seizures ([place])    grand mal, haven't had a seizure in "a long time"    Patient Active Problem List   Diagnosis Date Noted  . Gouty arthritis 08/14/2014  . Epilepsy, grand mal ([place]) 02/11/2014  . Status epilepticus ([place]) 11/23/2013  . Lactic acidosis 11/23/2013  . Gout 11/23/2013  . Spondylolisthesis of lumbar region 04/05/2013    Past Surgical History:  Procedure Laterality Date  . BACK SURGERY    . COLONOSCOPY          Home Medications    Prior to Admission medications   Medication Sig Start Date End Date Taking?  Authorizing Provider  allopurinol (ZYLOPRIM) 100 MG tablet Take 1 tablet (100 mg total) by mouth daily for 10 days. 12/06/17 12/16/17  Tacy Learn, PA-C  colchicine 0.6 MG tablet Take 1 tablet (0.6 mg total) by mouth daily for 10 doses. 12/06/17 12/16/17  Tacy Learn, PA-C  diazepam (VALIUM) 5 MG tablet Take 1 tablet (5 mg total) by mouth 2 (two) times daily. 12/09/15   Nona Dell, PA-C  divalproex (DEPAKOTE) 500 MG DR tablet Take 1 tablet by mouth twice daily, with a 250mg  tablet. Total dose 750mg  twice daily 02/26/15   Willeen Niece, MD  oxyCODONE-acetaminophen (PERCOCET) 5-325 MG tablet Take 1-2 tablets by mouth every 6 (six) hours as needed for severe pain. 04/14/18   Charlesetta Shanks, MD  predniSONE (DELTASONE) 20 MG tablet 3 tabs po day one, then 2 tabs daily x 4 days 04/14/18   Charlesetta Shanks, MD  topiramate (TOPAMAX) 100 MG tablet TAKE ONE TABLET BY MOUTH TWICE DAILY 01/14/16   Tresa Garter, MD    Family History Family History  Problem Relation Age of Onset  . Diabetes Paternal Grandmother     Social History Social History   Tobacco Use  . Smoking status: Current Every Day Smoker    Types: Cigarettes  .  Smokeless tobacco: Never Used  Substance Use Topics  . Alcohol use: Yes    Comment: occasional/socially  . Drug use: No     Allergies   Patient has no known allergies.   Review of Systems Review of Systems  Constitutional: Negative for fever.  HENT: Negative for sore throat.   Eyes: Negative for visual disturbance.  Respiratory: Negative for shortness of breath.   Cardiovascular: Negative for chest pain.  Gastrointestinal: Negative for abdominal pain.  Genitourinary: Negative for dysuria.  Musculoskeletal: Positive for arthralgias, gait problem and joint swelling.  Skin: Negative for rash.  Neurological: Negative for headaches.     Physical Exam Updated Vital Signs BP (!) 138/116   Pulse 86   Temp 98.2 F (36.8 C) (Oral)   Resp 18    SpO2 96%   Physical Exam Vitals signs and nursing note reviewed.  Constitutional:      Appearance: He is well-developed.  HENT:     Head: Normocephalic and atraumatic.  Eyes:     Conjunctiva/sclera: Conjunctivae normal.  Neck:     Musculoskeletal: Neck supple.  Cardiovascular:     Rate and Rhythm: Normal rate and regular rhythm.     Heart sounds: No murmur.  Pulmonary:     Effort: Pulmonary effort is normal. No respiratory distress.     Breath sounds: Normal breath sounds.  Abdominal:     Palpations: Abdomen is soft.     Tenderness: There is no abdominal tenderness.  Musculoskeletal:        General: Tenderness present.     Comments: He has moderate tenderness at the first MTP on the right.  Minimal overlying erythema.  He is also tender through the ankle and on the fifth MTP.  Primary pain is over the first MTP.  Cap refill and DP pulses intact.  Other joints unremarkable.  No open wounds.  No proximal streaking.  Skin:    General: Skin is warm and dry.     Capillary Refill: Capillary refill takes less than 2 seconds.  Neurological:     General: No focal deficit present.     Mental Status: He is alert.      ED Treatments / Results  Labs (all labs ordered are listed, but only abnormal results are displayed) Labs Reviewed - No data to display  EKG None  Radiology No results found.  Procedures Procedures (including critical care time)  Medications Ordered in ED Medications  predniSONE (DELTASONE) tablet 60 mg (has no administration in time range)  oxyCODONE-acetaminophen (PERCOCET/ROXICET) 5-325 MG per tablet 1 tablet (has no administration in time range)     Initial Impression / Assessment and Plan / ED Course  I have reviewed the triage vital signs and the nursing notes.  Pertinent labs & imaging results that were available during my care of the patient were reviewed by me and considered in my medical decision making (see chart for details).  Clinical Course  as of Dec 26 1700  Thu Dec 27, 2018  [id number] 33 year old male with history of gout complaining of right great toe pain and more diffuse foot pain increased with ambulation.  Afebrile here slightly hypertensive.  This is likely gout although he has had other arthralgias before and does not have crystal proven gout.  He had his right ankle tapped 8 months ago that did not show crystals but also was culture negative.  He said he responded to prednisone for that.  He does not have primary care doctor but I  think he ultimately should see some rheumatologist for further evaluation.  Will treat with prednisone and pain medicine and clear instructions to return if any worsening symptoms.   [MB]    Clinical Course User Index [MB] Terrilee FilesButler, Nicol Herbig C, MD        Final Clinical Impressions(s) / ED Diagnoses   Final diagnoses:  Foot pain, right    ED Discharge Orders         Ordered    predniSONE (STERAPRED UNI-PAK 21 TAB) 10 MG (21) TBPK tablet  Daily     12/27/18 1136    oxyCODONE-acetaminophen (PERCOCET/ROXICET) 5-325 MG tablet  Every 6 hours PRN     12/27/18 1136           Terrilee FilesButler, Essance Gatti C, MD 12/27/18 1702

## 2019-02-22 ENCOUNTER — Other Ambulatory Visit: Payer: Self-pay

## 2019-02-22 ENCOUNTER — Emergency Department (HOSPITAL_BASED_OUTPATIENT_CLINIC_OR_DEPARTMENT_OTHER)
Admission: EM | Admit: 2019-02-22 | Discharge: 2019-02-22 | Disposition: A | Discharge status: Home / Self Care | Payer: Self-pay | Attending: Emergency Medicine | Admitting: Emergency Medicine

## 2019-02-22 ENCOUNTER — Encounter (HOSPITAL_BASED_OUTPATIENT_CLINIC_OR_DEPARTMENT_OTHER): Payer: Self-pay | Admitting: Emergency Medicine

## 2019-02-22 DIAGNOSIS — M10071 Idiopathic gout, right ankle and foot: Secondary | ICD-10-CM | POA: Insufficient documentation

## 2019-02-22 DIAGNOSIS — Z79899 Other long term (current) drug therapy: Secondary | ICD-10-CM | POA: Insufficient documentation

## 2019-02-22 DIAGNOSIS — M10072 Idiopathic gout, left ankle and foot: Secondary | ICD-10-CM | POA: Insufficient documentation

## 2019-02-22 DIAGNOSIS — F17210 Nicotine dependence, cigarettes, uncomplicated: Secondary | ICD-10-CM | POA: Insufficient documentation

## 2019-02-22 DIAGNOSIS — G40909 Epilepsy, unspecified, not intractable, without status epilepticus: Secondary | ICD-10-CM | POA: Insufficient documentation

## 2019-02-22 DIAGNOSIS — M109 Gout, unspecified: Secondary | ICD-10-CM

## 2019-02-22 MED ORDER — OXYCODONE-ACETAMINOPHEN 5-325 MG PO TABS
1.0000 | ORAL_TABLET | Freq: Once | ORAL | Status: AC
  Administered 2019-02-22: 09:00:00 1 via ORAL
  Filled 2019-02-22: qty 1

## 2019-02-22 MED ORDER — ONDANSETRON 4 MG PO TBDP
4.0000 mg | ORAL_TABLET | Freq: Once | ORAL | Status: AC
  Administered 2019-02-22: 4 mg via ORAL
  Filled 2019-02-22: qty 1

## 2019-02-22 MED ORDER — OXYCODONE-ACETAMINOPHEN 5-325 MG PO TABS: 2.0000 | ORAL_TABLET | ORAL | 0 refills | Status: AC | PRN

## 2019-02-22 MED ORDER — COLCHICINE 0.6 MG PO TABS: ORAL_TABLET | ORAL | 0 refills | Status: AC

## 2019-02-22 MED ORDER — DEXAMETHASONE SODIUM PHOSPHATE 10 MG/ML IJ SOLN
10.0000 mg | Freq: Once | INTRAMUSCULAR | Status: AC
  Administered 2019-02-22: 10 mg via INTRAMUSCULAR
  Filled 2019-02-22: qty 1

## 2019-02-22 MED ORDER — PREDNISONE 10 MG (21) PO TBPK: ORAL_TABLET | ORAL | 0 refills | Status: DC

## 2019-02-22 MED ORDER — ONDANSETRON 4 MG PO TBDP: 4.0000 mg | ORAL_TABLET | Freq: Three times a day (TID) | ORAL | 0 refills | Status: AC | PRN

## 2019-02-22 NOTE — ED Provider Notes (Signed)
[facility] EMERGENCY DEPARTMENT Provider Note   CSN: [id number] Arrival date & time: 02/22/19  9702     History   Chief Complaint Chief Complaint  Patient presents with  . Foot Pain    HPI Christopher Gilbert is a 33 y.o. male.     Pt presents to the ED today with pain in both feet.  The pt has a hx of gout and feels like he has a gout flare up.  The pt said he developed pain last night.  The pt said his insurance won't pay for colchicine.  He has been taking allopurinol.  No f/c.  No n/v.       Past Medical History:  Diagnosis Date  . Arthritis    gout in right big toe  . Attention deficit hyperactivity disorder   . Epilepsy ([place])   . GERD (gastroesophageal reflux disease)   . Gout   . Headache(784.0)    migraines after seizure  . Obesity   . Seizures ([place])    grand mal, haven't had a seizure in "a long time"    Patient Active Problem List   Diagnosis Date Noted  . Gouty arthritis 08/14/2014  . Epilepsy, grand mal ([place]) 02/11/2014  . Status epilepticus ([place]) 11/23/2013  . Lactic acidosis 11/23/2013  . Gout 11/23/2013  . Spondylolisthesis of lumbar region 04/05/2013    Past Surgical History:  Procedure Laterality Date  . BACK SURGERY    . COLONOSCOPY          Home Medications    Prior to Admission medications   Medication Sig Start Date End Date Taking? Authorizing Provider  omeprazole (PRILOSEC) 40 MG capsule Take by mouth. 11/04/15  Yes [provider]  allopurinol (ZYLOPRIM) 100 MG tablet Take 1 tablet (100 mg total) by mouth daily for 10 days. 12/06/17 12/16/17  Tacy Learn, PA-C  colchicine 0.6 MG tablet Take 1.2 mg oral colchicine followed by another 0.6 mg 1 hr later.  Then 0.6 mg BID until flare resolves, then resume 0.6 mg daily dose. 02/22/19   Isla Pence, MD  divalproex (DEPAKOTE) 250 MG DR tablet Take 250 mg by mouth 2 (two) times daily. 02/12/19   [provider]  ondansetron (ZOFRAN ODT) 4 MG  disintegrating tablet Take 1 tablet (4 mg total) by mouth every 8 (eight) hours as needed. 02/22/19   Isla Pence, MD  oxyCODONE-acetaminophen (PERCOCET/ROXICET) 5-325 MG tablet Take 2 tablets by mouth every 4 (four) hours as needed for severe pain. 02/22/19   Isla Pence, MD  predniSONE (STERAPRED UNI-PAK 21 TAB) 10 MG (21) TBPK tablet Take 6 tabs for 2 days, then 5 for 2 days, then 4 for 2 days, then 3 for 2 days, 2 for 2 days, then 1 for 2 days 02/22/19   Isla Pence, MD  topiramate (TOPAMAX) 100 MG tablet TAKE ONE TABLET BY MOUTH TWICE DAILY 01/14/16   Tresa Garter, MD    Family History Family History  Problem Relation Age of Onset  . Diabetes Paternal Grandmother     Social History Social History   Tobacco Use  . Smoking status: Current Every Day Smoker    Types: Cigarettes  . Smokeless tobacco: Never Used  Substance Use Topics  . Alcohol use: Yes    Comment: occasional/socially  . Drug use: No     Allergies   Patient has no known allergies.   Review of Systems Review of Systems  Musculoskeletal:  Bilateral foot pain  All other systems reviewed and are negative.    Physical Exam Updated Vital Signs BP (!) 170/94   Pulse 95   Temp 99.4 F (37.4 C) (Oral)   Resp (!) 22   Ht 6' (1.829 m)   Wt 117.5 kg   SpO2 98%   BMI 35.13 kg/m   Physical Exam Vitals signs and nursing note reviewed.  Constitutional:      Appearance: Normal appearance.  HENT:     Head: Normocephalic and atraumatic.     Right Ear: External ear normal.     Left Ear: External ear normal.     Nose: Nose normal.     Mouth/Throat:     Mouth: Mucous membranes are moist.     Pharynx: Oropharynx is clear.  Eyes:     Extraocular Movements: Extraocular movements intact.     Conjunctiva/sclera: Conjunctivae normal.     Pupils: Pupils are equal, round, and reactive to light.  Neck:     Musculoskeletal: Normal range of motion and neck supple.  Cardiovascular:     Rate  and Rhythm: Normal rate and regular rhythm.     Pulses: Normal pulses.     Heart sounds: Normal heart sounds.  Pulmonary:     Effort: Pulmonary effort is normal.     Breath sounds: Normal breath sounds.  Abdominal:     General: Abdomen is flat. Bowel sounds are normal.     Palpations: Abdomen is soft.  Musculoskeletal: Normal range of motion.     Comments: Pain in both feet.  Mild redness over the dorsum of both toes.  No cellulitis.  Skin:    General: Skin is warm.     Capillary Refill: Capillary refill takes less than 2 seconds.  Neurological:     General: No focal deficit present.     Mental Status: He is alert.  Psychiatric:        Mood and Affect: Mood normal.        Behavior: Behavior normal.        Thought Content: Thought content normal.        Judgment: Judgment normal.      ED Treatments / Results  Labs (all labs ordered are listed, but only abnormal results are displayed) Labs Reviewed - No data to display  EKG None  Radiology No results found.  Procedures Procedures (including critical care time)  Medications Ordered in ED Medications  dexamethasone (DECADRON) injection 10 mg (has no administration in time range)  oxyCODONE-acetaminophen (PERCOCET/ROXICET) 5-325 MG per tablet 1 tablet (has no administration in time range)  ondansetron (ZOFRAN-ODT) disintegrating tablet 4 mg (has no administration in time range)     Initial Impression / Assessment and Plan / ED Course  I have reviewed the triage vital signs and the nursing notes.  Pertinent labs & imaging results that were available during my care of the patient were reviewed by me and considered in my medical decision making (see chart for details).       Pt will be treated for gout as he said he has a hx of gout.  He is instructed to eat a low purine diet.  He knows to return if worse.  F/u with pcp.  Final Clinical Impressions(s) / ED Diagnoses   Final diagnoses:  Acute gout of foot,  unspecified cause, unspecified laterality    ED Discharge Orders         Ordered    predniSONE (STERAPRED UNI-PAK 21 TAB)  10 MG (21) TBPK tablet     02/22/19 0858    oxyCODONE-acetaminophen (PERCOCET/ROXICET) 5-325 MG tablet  Every 4 hours PRN     02/22/19 0858    ondansetron (ZOFRAN ODT) 4 MG disintegrating tablet  Every 8 hours PRN     02/22/19 0858    colchicine 0.6 MG tablet     02/22/19 0915           Jacalyn LefevreHaviland, Jheremy Boger, MD 02/22/19 [phone]

## 2019-02-22 NOTE — ED Triage Notes (Signed)
Bilateral foot pain since yesterday.  Hx of gout.  Pt states he allopurinol daily and colchicine for flare ups.  He doesn't have his colchicine at home due to insurance issues.  Pt denies injury or fevers at home.

## 2019-02-28 ENCOUNTER — Emergency Department (HOSPITAL_BASED_OUTPATIENT_CLINIC_OR_DEPARTMENT_OTHER): Payer: Self-pay

## 2019-02-28 ENCOUNTER — Encounter (HOSPITAL_BASED_OUTPATIENT_CLINIC_OR_DEPARTMENT_OTHER): Payer: Self-pay | Admitting: Emergency Medicine

## 2019-02-28 ENCOUNTER — Other Ambulatory Visit: Payer: Self-pay

## 2019-02-28 ENCOUNTER — Emergency Department (HOSPITAL_BASED_OUTPATIENT_CLINIC_OR_DEPARTMENT_OTHER)
Admission: EM | Admit: 2019-02-28 | Discharge: 2019-02-28 | Disposition: A | Discharge status: Home / Self Care | Payer: Self-pay | Attending: Emergency Medicine | Admitting: Emergency Medicine

## 2019-02-28 DIAGNOSIS — M10071 Idiopathic gout, right ankle and foot: Secondary | ICD-10-CM | POA: Insufficient documentation

## 2019-02-28 DIAGNOSIS — Z79899 Other long term (current) drug therapy: Secondary | ICD-10-CM | POA: Insufficient documentation

## 2019-02-28 DIAGNOSIS — M109 Gout, unspecified: Secondary | ICD-10-CM

## 2019-02-28 DIAGNOSIS — F17210 Nicotine dependence, cigarettes, uncomplicated: Secondary | ICD-10-CM | POA: Insufficient documentation

## 2019-02-28 MED ORDER — ALLOPURINOL 100 MG PO TABS: 100.0000 mg | ORAL_TABLET | Freq: Every day | ORAL | 0 refills | Status: AC

## 2019-02-28 MED ORDER — KETOROLAC TROMETHAMINE 60 MG/2ML IM SOLN
60.0000 mg | Freq: Once | INTRAMUSCULAR | Status: AC
  Administered 2019-02-28: 60 mg via INTRAMUSCULAR
  Filled 2019-02-28: qty 2

## 2019-02-28 MED ORDER — PREDNISONE 20 MG PO TABS: ORAL_TABLET | ORAL | 0 refills | Status: AC

## 2019-02-28 NOTE — ED Triage Notes (Signed)
Pt here for gout pain, same as last visit.  Pt did not get prescriptions filled that were ordered due to cost.  Pt states he was concerned because he could see the veins in his foot.  Pt also relates the swelling has gone down in his foot.  No calf pain.  Pt is concerned that something is wrong.

## 2019-02-28 NOTE — ED Provider Notes (Signed)
[facility] EMERGENCY DEPARTMENT Provider Note   CSN: [id number] Arrival date & time: 02/28/19  [id number]     History   Chief Complaint Chief Complaint  Patient presents with   Foot Pain    HPI Christopher Gilbert is a 33 y.o. male.     Patient is a [age] year old male who presents with right foot pain.  He has a history of gout and says that he has had a flareup in his right foot for about 2 weeks.  He said he frequently has pain in his big toe or ankle but he has had pain in his foot before.  Usually it does not hurt unless he is walking on it.  He was here about a week ago and was prescribed prednisone.  He said it actually got better while he was on the prednisone but started getting worse again after he stopped the prednisone.  He denies any known fevers.  No known injury.  No swelling in his leg.  He is concerned because he feels like his veins are popped out more than they normally are.  He also has had trouble getting his allopurinol.  Recently he has found out that he can get it and get restarted on it if he gets a new prescription.     Past Medical History:  Diagnosis Date   Arthritis    gout in right big toe   Attention deficit hyperactivity disorder    Epilepsy ([facility])    GERD (gastroesophageal reflux disease)    Gout    Headache(784.0)    migraines after seizure   Obesity    Seizures ([facility])    grand mal, haven't had a seizure in "a long time"    Patient Active Problem List   Diagnosis Date Noted   Gouty arthritis 08/14/2014   Epilepsy, grand mal ([facility]) 02/11/2014   Status epilepticus ([facility]) 11/23/2013   Lactic acidosis 11/23/2013   Gout 11/23/2013   Spondylolisthesis of lumbar region 04/05/2013    Past Surgical History:  Procedure Laterality Date   BACK SURGERY     COLONOSCOPY          Home Medications    Prior to Admission medications   Medication Sig Start Date End Date Taking? Authorizing Provider  allopurinol (ZYLOPRIM)  100 MG tablet Take 1 tablet (100 mg total) by mouth daily for 14 days. 02/28/19 03/14/19  Rolan BuccoBelfi, Skylinn Vialpando, MD  colchicine 0.6 MG tablet Take 1.2 mg oral colchicine followed by another 0.6 mg 1 hr later.  Then 0.6 mg BID until flare resolves, then resume 0.6 mg daily dose. 02/22/19   Jacalyn LefevreHaviland, Julie, MD  divalproex (DEPAKOTE) 250 MG DR tablet Take 250 mg by mouth 2 (two) times daily. 02/12/19   [provider]  omeprazole (PRILOSEC) 40 MG capsule Take by mouth. 11/04/15   [provider]  ondansetron (ZOFRAN ODT) 4 MG disintegrating tablet Take 1 tablet (4 mg total) by mouth every 8 (eight) hours as needed. 02/22/19   Jacalyn LefevreHaviland, Julie, MD  oxyCODONE-acetaminophen (PERCOCET/ROXICET) 5-325 MG tablet Take 2 tablets by mouth every 4 (four) hours as needed for severe pain. 02/22/19   Jacalyn LefevreHaviland, Julie, MD  predniSONE (DELTASONE) 20 MG tablet 3 tabs po day one, then 2 po daily x 4 days 02/28/19   Rolan BuccoBelfi, Ondre Salvetti, MD  topiramate (TOPAMAX) 100 MG tablet TAKE ONE TABLET BY MOUTH TWICE DAILY 01/14/16   Quentin AngstJegede, Olugbemiga E, MD    Family History Family History  Problem Relation Age of Onset  Diabetes Paternal Grandmother     Social History Social History   Tobacco Use   Smoking status: Current Every Day Smoker    Types: Cigarettes   Smokeless tobacco: Never Used  Substance Use Topics   Alcohol use: Yes    Comment: occasional/socially   Drug use: No     Allergies   Patient has no known allergies.   Review of Systems Review of Systems  Constitutional: Negative for fever.  Gastrointestinal: Negative for nausea and vomiting.  Musculoskeletal: Positive for arthralgias and joint swelling. Negative for back pain and neck pain.  Skin: Negative for wound.  Neurological: Negative for weakness, numbness and headaches.     Physical Exam Updated Vital Signs BP (!) 143/98 (BP Location: Right Arm)    Pulse 87    Temp 98.7 F (37.1 C) (Oral)    Resp 18    Ht 6' (1.829 m)    Wt 116.6  kg    SpO2 97%    BMI 34.86 kg/m   Physical Exam Constitutional:      Appearance: He is well-developed.  HENT:     Head: Normocephalic and atraumatic.  Neck:     Musculoskeletal: Normal range of motion and neck supple.  Cardiovascular:     Rate and Rhythm: Normal rate.  Pulmonary:     Effort: Pulmonary effort is normal.  Musculoskeletal:        General: Tenderness present.     Comments: Patient has some mild swelling and diffuse tenderness across the dorsum of his right foot, primarily over the third and fourth metatarsals.  There is some very minimal swelling around the right ankle.  No bony tenderness is noted to this area.  There is no tenderness or swelling to the calf or lower leg.  No pain to the knee.  Pedal pulses are intact.  There are some mild warmth but no erythema overlying the foot.  Pedal pulses are intact.  Skin:    General: Skin is warm and dry.  Neurological:     Mental Status: He is alert and oriented to person, place, and time.      ED Treatments / Results  Labs (all labs ordered are listed, but only abnormal results are displayed) Labs Reviewed - No data to display  EKG None  Radiology Dg Foot Complete Right  Result Date: 02/28/2019 CLINICAL DATA:  33 year old male with right foot pain. EXAM: RIGHT FOOT COMPLETE - 3+ VIEW COMPARISON:  Left foot radiograph dated 10/11/2016. FINDINGS: There is no acute fracture or dislocation. There is erosive changes of the medial aspect of the first metatarsal head with mild overlying soft tissue thickening. Correlation with clinical exam is recommended to evaluate for an inflammatory arthropathy or gout. IMPRESSION: 1. No acute fracture or dislocation. 2. Erosive changes of the medial aspect of the first metatarsal head with mild overlying soft tissue thickening. Findings most consistent with gout. Clinical correlation is recommended. Electronically Signed   By: Anner Crete M.D.   On: 02/28/2019 11:31     Procedures Procedures (including critical care time)  Medications Ordered in ED Medications  ketorolac (TORADOL) injection 60 mg (60 mg Intramuscular Given 02/28/19 1136)     Initial Impression / Assessment and Plan / ED Course  I have reviewed the triage vital signs and the nursing notes.  Pertinent labs & imaging results that were available during my care of the patient were reviewed by me and considered in my medical decision making (see chart for details).  Patient is a 33 year old male who presents with pain and swelling in his right foot.  He has some mild warmth but no erythema.  His symptoms sound consistent with gout.  I have a very low suspicion of septic joint.  He has had similar symptoms in the past with gout.  He did have x-ray which shows some gouty changes but no acute findings.  He was started back on a prednisone course since this helped him last week.  He may need just a little longer course of prednisone.  He states his insurance will not approve the colchicine.  I did give him a prescription for the allopurinol.  He has a follow-up appointment with his PCP in a couple weeks.  Return precautions were given.  Final Clinical Impressions(s) / ED Diagnoses   Final diagnoses:  Acute gout of right foot, unspecified cause    ED Discharge Orders         Ordered    allopurinol (ZYLOPRIM) 100 MG tablet  Daily     02/28/19 1157    predniSONE (DELTASONE) 20 MG tablet     02/28/19 1157           Rolan Bucco, MD 02/28/19 1207

## 2019-02-28 NOTE — ED Notes (Signed)
Pt did not want an ice pack on ankle

## 2021-06-03 IMAGING — DX DG FOOT COMPLETE 3+V*R*
3 series · 3 of 3 positions shown · non-contrast
Comparison: Left foot radiograph dated 10/11/2016.

CLINICAL DATA: 33-year-old male with right foot pain.

EXAM:
RIGHT FOOT COMPLETE - 3+ VIEW

[foot ap]
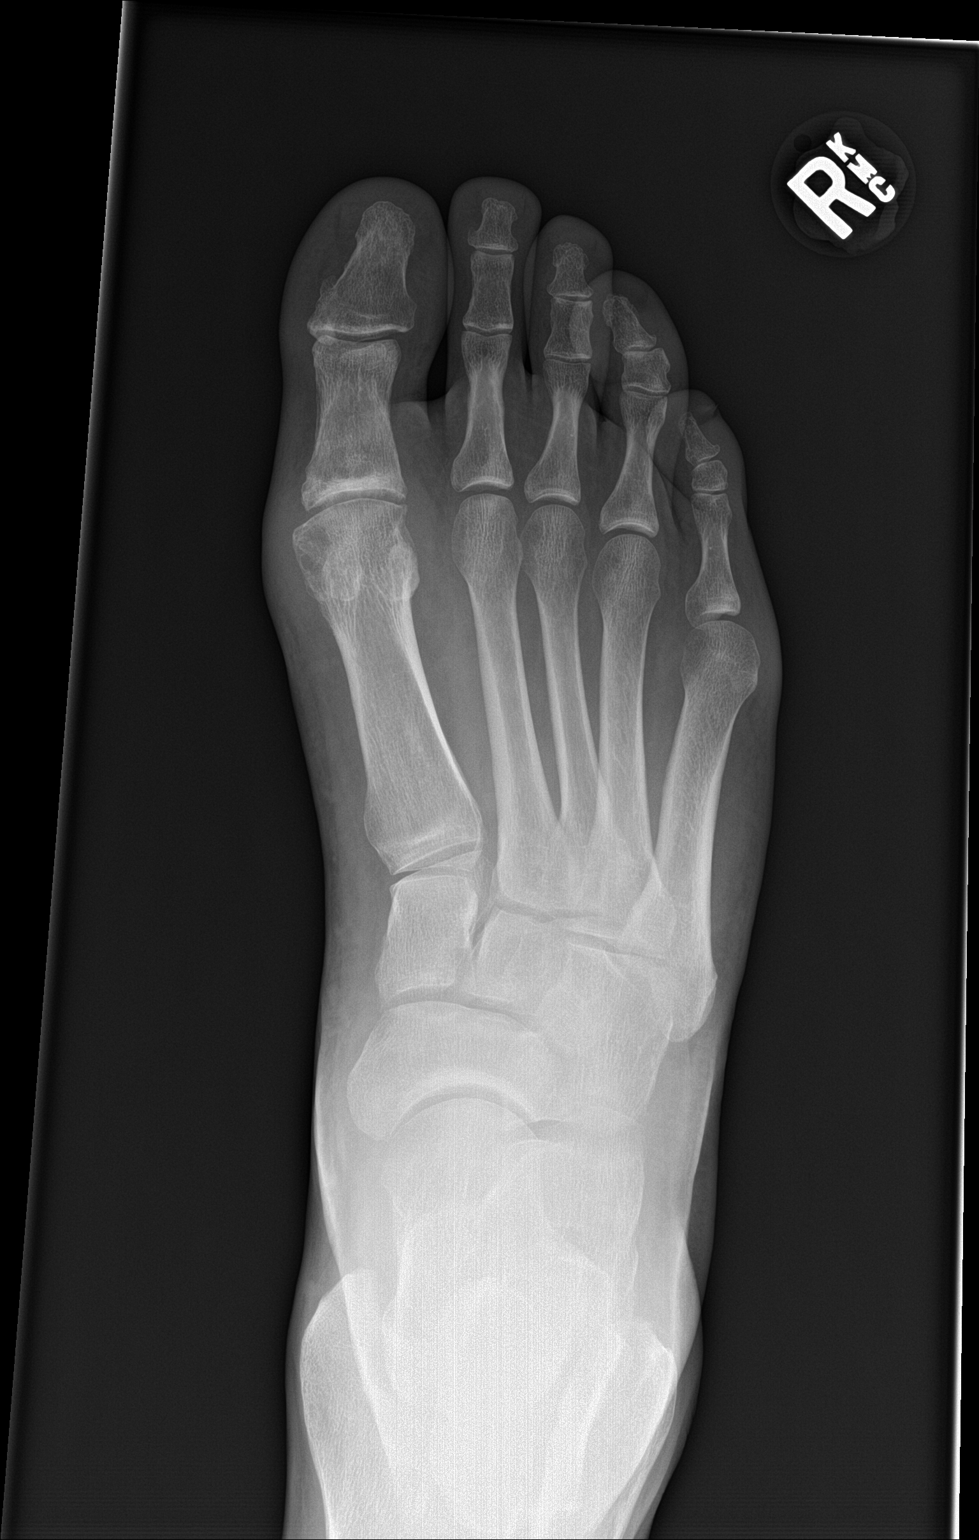

[foot obl]
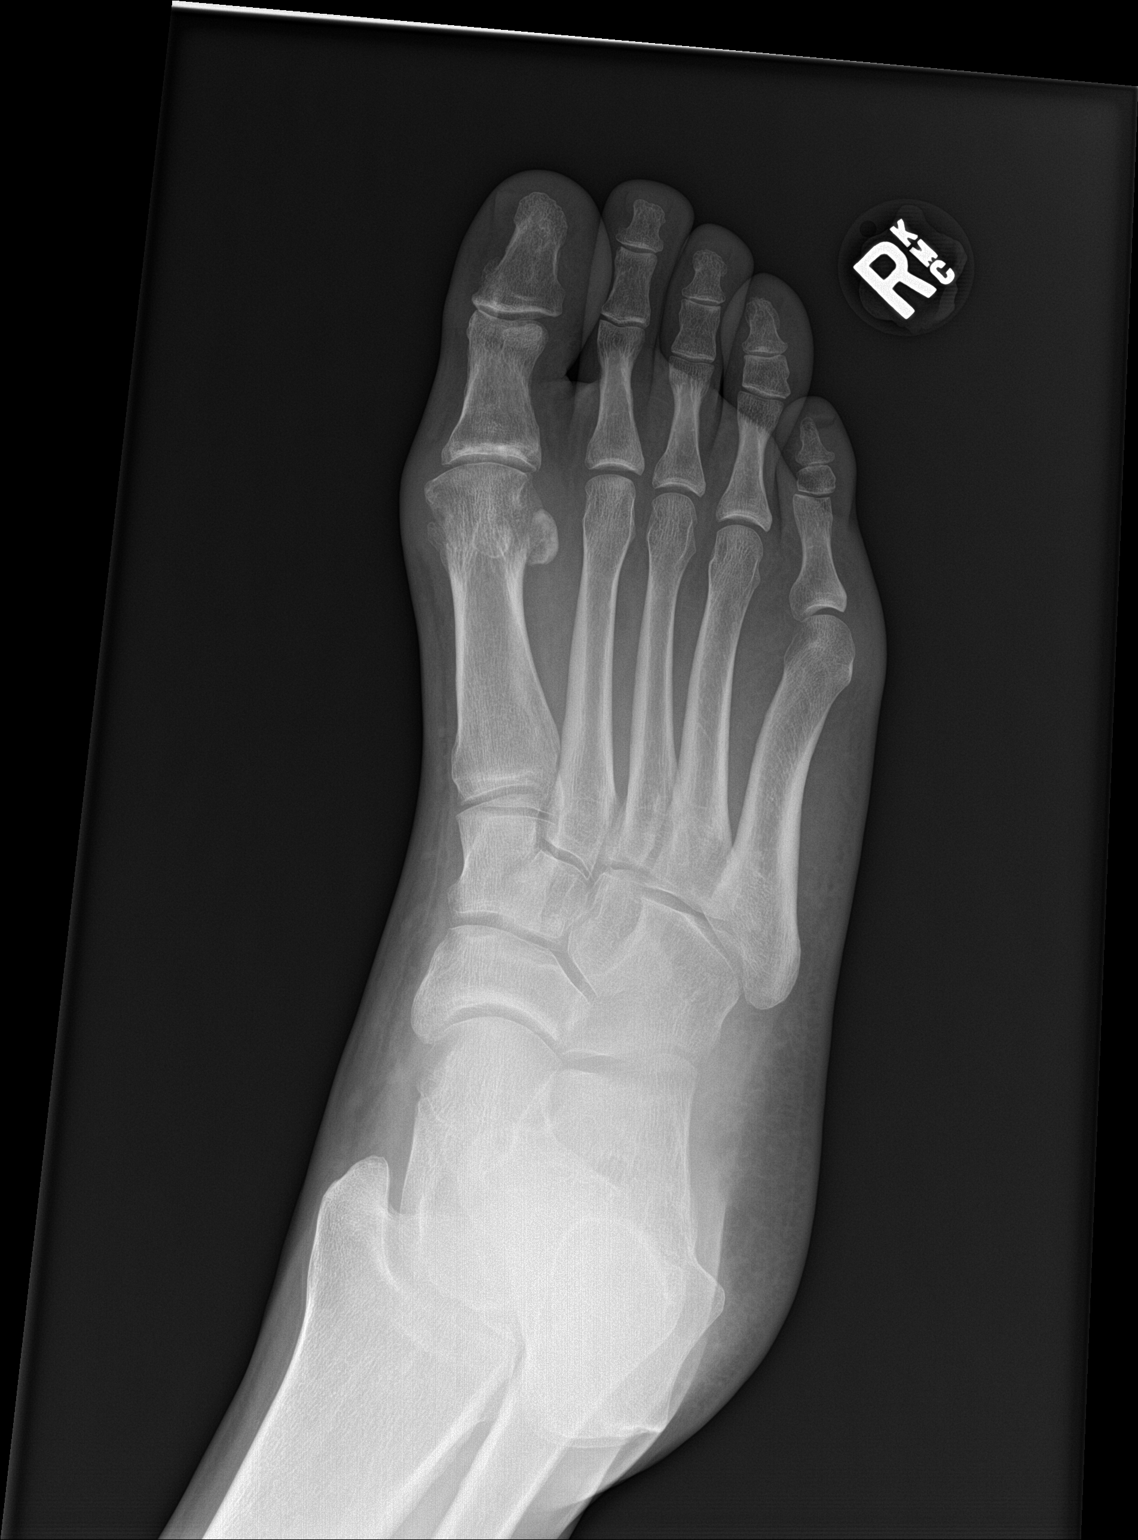

[foot lat]
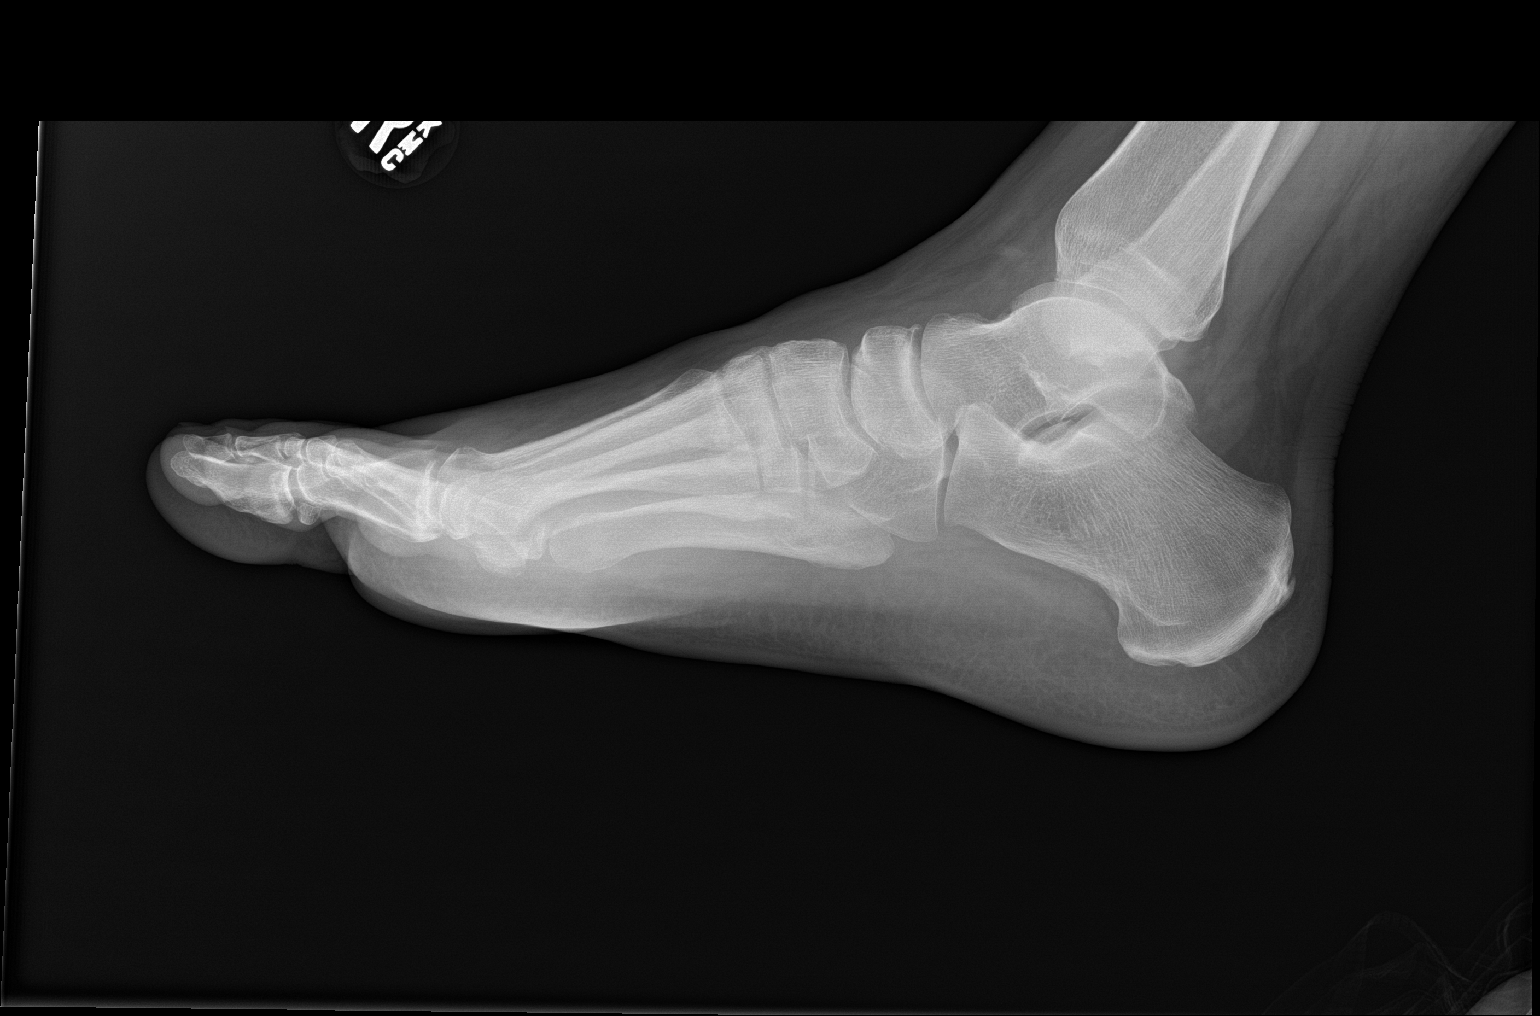

[3 of 3 positions shown; findings below may reference images not displayed]

FINDINGS: There is no acute fracture or dislocation. There is erosive changes
of the medial aspect of the first metatarsal head with mild
overlying soft tissue thickening. Correlation with clinical exam is
recommended to evaluate for an inflammatory arthropathy or gout.
IMPRESSION: 1. No acute fracture or dislocation.
2. Erosive changes of the medial aspect of the first metatarsal head
with mild overlying soft tissue thickening. Findings most consistent
with gout. Clinical correlation is recommended.
